# Patient Record
Sex: Male | Born: 1945
Health system: Southern US, Community
[De-identification: ages and names within clinical notes are randomized; demographics above are authoritative.]

## PROBLEM LIST (undated history)

## (undated) DIAGNOSIS — M1990 Unspecified osteoarthritis, unspecified site: Secondary | ICD-10-CM

## (undated) DIAGNOSIS — H9190 Unspecified hearing loss, unspecified ear: Secondary | ICD-10-CM

## (undated) DIAGNOSIS — Z9889 Other specified postprocedural states: Secondary | ICD-10-CM

## (undated) DIAGNOSIS — R112 Nausea with vomiting, unspecified: Secondary | ICD-10-CM

## (undated) DIAGNOSIS — G629 Polyneuropathy, unspecified: Secondary | ICD-10-CM

## (undated) DIAGNOSIS — E119 Type 2 diabetes mellitus without complications: Secondary | ICD-10-CM

## (undated) DIAGNOSIS — C20 Malignant neoplasm of rectum: Secondary | ICD-10-CM

## (undated) HISTORY — PX: KNEE SURGERY: SHX244

## (undated) HISTORY — PX: CHOLECYSTECTOMY: SHX55

## (undated) HISTORY — PX: OTHER SURGICAL HISTORY: SHX169

## (undated) HISTORY — PX: COLOSTOMY: SHX63

## (undated) HISTORY — DX: Malignant neoplasm of rectum: C20

---

## 1999-09-18 ENCOUNTER — Ambulatory Visit (HOSPITAL_COMMUNITY): Admission: RE | Admit: 1999-09-18 | Discharge: 1999-09-18 | Payer: Self-pay | Admitting: Gastroenterology

## 1999-09-18 ENCOUNTER — Encounter (INDEPENDENT_AMBULATORY_CARE_PROVIDER_SITE_OTHER): Payer: Self-pay | Admitting: *Deleted

## 1999-09-29 ENCOUNTER — Encounter: Payer: Self-pay | Admitting: Surgery

## 1999-10-09 ENCOUNTER — Inpatient Hospital Stay (HOSPITAL_COMMUNITY): Admission: RE | Admit: 1999-10-09 | Discharge: 1999-10-16 | Payer: Self-pay | Admitting: Surgery

## 1999-10-09 ENCOUNTER — Encounter (INDEPENDENT_AMBULATORY_CARE_PROVIDER_SITE_OTHER): Payer: Self-pay | Admitting: Specialist

## 1999-11-03 ENCOUNTER — Encounter: Payer: Self-pay | Admitting: Emergency Medicine

## 1999-11-03 ENCOUNTER — Inpatient Hospital Stay (HOSPITAL_COMMUNITY): Admission: EM | Admit: 1999-11-03 | Discharge: 1999-11-06 | Payer: Self-pay | Admitting: Emergency Medicine

## 1999-11-04 ENCOUNTER — Encounter: Payer: Self-pay | Admitting: Surgery

## 1999-12-12 ENCOUNTER — Encounter: Admission: RE | Admit: 1999-12-12 | Discharge: 2000-03-11 | Payer: Self-pay | Admitting: *Deleted

## 2000-06-25 ENCOUNTER — Encounter: Payer: Self-pay | Admitting: Hematology & Oncology

## 2000-06-25 ENCOUNTER — Encounter: Admission: RE | Admit: 2000-06-25 | Discharge: 2000-06-25 | Payer: Self-pay | Admitting: Hematology & Oncology

## 2000-10-28 ENCOUNTER — Encounter (INDEPENDENT_AMBULATORY_CARE_PROVIDER_SITE_OTHER): Payer: Self-pay

## 2000-10-28 ENCOUNTER — Ambulatory Visit (HOSPITAL_COMMUNITY): Admission: RE | Admit: 2000-10-28 | Discharge: 2000-10-28 | Payer: Self-pay | Admitting: Gastroenterology

## 2001-01-18 ENCOUNTER — Ambulatory Visit (HOSPITAL_COMMUNITY): Admission: RE | Admit: 2001-01-18 | Discharge: 2001-01-18 | Payer: Self-pay | Admitting: Orthopedic Surgery

## 2003-01-04 ENCOUNTER — Encounter: Payer: Self-pay | Admitting: Orthopedic Surgery

## 2003-01-04 ENCOUNTER — Ambulatory Visit (HOSPITAL_COMMUNITY): Admission: RE | Admit: 2003-01-04 | Discharge: 2003-01-04 | Payer: Self-pay | Admitting: Orthopedic Surgery

## 2004-09-17 ENCOUNTER — Ambulatory Visit: Payer: Self-pay | Admitting: Hematology & Oncology

## 2005-09-15 ENCOUNTER — Ambulatory Visit: Payer: Self-pay | Admitting: Hematology & Oncology

## 2006-09-10 ENCOUNTER — Ambulatory Visit: Payer: Self-pay | Admitting: Hematology & Oncology

## 2006-09-15 LAB — CBC WITH DIFFERENTIAL/PLATELET
Eosinophils Absolute: 0.2 10*3/uL (ref 0.0–0.5)
LYMPH%: 22.6 % (ref 14.0–48.0)
MONO#: 0.5 10*3/uL (ref 0.1–0.9)
NEUT#: 4 10*3/uL (ref 1.5–6.5)
Platelets: 254 10*3/uL (ref 145–400)
RBC: 4.88 10*6/uL (ref 4.20–5.71)
WBC: 6.2 10*3/uL (ref 4.0–10.0)

## 2006-09-15 LAB — COMPREHENSIVE METABOLIC PANEL
ALT: 11 U/L (ref 0–53)
Albumin: 4.3 g/dL (ref 3.5–5.2)
CO2: 29 mEq/L (ref 19–32)
Calcium: 9.5 mg/dL (ref 8.4–10.5)
Chloride: 106 mEq/L (ref 96–112)
Creatinine, Ser: 0.93 mg/dL (ref 0.40–1.50)
Sodium: 143 mEq/L (ref 135–145)
Total Protein: 6.9 g/dL (ref 6.0–8.3)

## 2006-09-15 LAB — CEA: CEA: 1.1 ng/mL (ref 0.0–5.0)

## 2007-09-12 ENCOUNTER — Ambulatory Visit: Payer: Self-pay | Admitting: Hematology & Oncology

## 2007-09-14 LAB — CBC WITH DIFFERENTIAL/PLATELET
Basophils Absolute: 0 10*3/uL (ref 0.0–0.1)
Eosinophils Absolute: 0.3 10*3/uL (ref 0.0–0.5)
LYMPH%: 17.8 % (ref 14.0–48.0)
MCV: 86.3 fL (ref 81.6–98.0)
MONO%: 8.1 % (ref 0.0–13.0)
NEUT#: 5.6 10*3/uL (ref 1.5–6.5)
Platelets: 271 10*3/uL (ref 145–400)
RBC: 4.54 10*6/uL (ref 4.20–5.71)

## 2007-09-14 LAB — CEA: CEA: 0.7 ng/mL (ref 0.0–5.0)

## 2008-09-19 ENCOUNTER — Ambulatory Visit: Payer: Self-pay | Admitting: Hematology & Oncology

## 2009-09-11 ENCOUNTER — Ambulatory Visit: Payer: Self-pay | Admitting: Hematology & Oncology

## 2009-09-12 LAB — CBC WITH DIFFERENTIAL (CANCER CENTER ONLY)
BASO#: 0 10*3/uL (ref 0.0–0.2)
BASO%: 0.6 % (ref 0.0–2.0)
EOS%: 4.3 % (ref 0.0–7.0)
MCH: 29.8 pg (ref 28.0–33.4)
MCHC: 33.7 g/dL (ref 32.0–35.9)
MONO%: 6 % (ref 0.0–13.0)
NEUT#: 4.5 10*3/uL (ref 1.5–6.5)
Platelets: 278 10*3/uL (ref 145–400)
RDW: 11.5 % (ref 10.5–14.6)

## 2009-09-12 LAB — COMPREHENSIVE METABOLIC PANEL
AST: 21 U/L (ref 0–37)
Alkaline Phosphatase: 58 U/L (ref 39–117)
Glucose, Bld: 310 mg/dL — ABNORMAL HIGH (ref 70–99)
Sodium: 139 mEq/L (ref 135–145)
Total Bilirubin: 0.3 mg/dL (ref 0.3–1.2)
Total Protein: 6.9 g/dL (ref 6.0–8.3)

## 2009-09-12 LAB — CEA: CEA: 0.6 ng/mL (ref 0.0–5.0)

## 2010-09-18 ENCOUNTER — Other Ambulatory Visit: Payer: Self-pay | Admitting: Hematology & Oncology

## 2010-09-18 ENCOUNTER — Encounter (HOSPITAL_BASED_OUTPATIENT_CLINIC_OR_DEPARTMENT_OTHER): Payer: Medicare Other | Admitting: Hematology & Oncology

## 2010-09-18 DIAGNOSIS — Z85048 Personal history of other malignant neoplasm of rectum, rectosigmoid junction, and anus: Secondary | ICD-10-CM

## 2010-09-18 DIAGNOSIS — C20 Malignant neoplasm of rectum: Secondary | ICD-10-CM

## 2010-09-18 LAB — CBC WITH DIFFERENTIAL (CANCER CENTER ONLY)
BASO#: 0 10*3/uL (ref 0.0–0.2)
BASO%: 0.4 % (ref 0.0–2.0)
EOS%: 2.2 % (ref 0.0–7.0)
Eosinophils Absolute: 0.2 10*3/uL (ref 0.0–0.5)
HCT: 42.8 % (ref 38.7–49.9)
HGB: 14.1 g/dL (ref 13.0–17.1)
LYMPH#: 1.7 10*3/uL (ref 0.9–3.3)
LYMPH%: 16.9 % (ref 14.0–48.0)
MCH: 29.2 pg (ref 28.0–33.4)
MCHC: 33 g/dL (ref 32.0–35.9)
MCV: 89 fL (ref 82–98)
MONO#: 0.4 10*3/uL (ref 0.1–0.9)
MONO%: 4.1 % (ref 0.0–13.0)
NEUT#: 7.8 10*3/uL — ABNORMAL HIGH (ref 1.5–6.5)
NEUT%: 76.4 % (ref 40.0–80.0)
Platelets: 257 10*3/uL (ref 145–400)
RBC: 4.83 10*6/uL (ref 4.20–5.70)
RDW: 11.8 % (ref 10.5–14.6)
WBC: 10.2 10*3/uL — ABNORMAL HIGH (ref 4.0–10.0)

## 2010-09-18 LAB — CEA: CEA: 1.2 ng/mL (ref 0.0–5.0)

## 2010-09-18 LAB — COMPREHENSIVE METABOLIC PANEL
ALT: 15 U/L (ref 0–53)
AST: 17 U/L (ref 0–37)
Albumin: 4.4 g/dL (ref 3.5–5.2)
Alkaline Phosphatase: 50 U/L (ref 39–117)
BUN: 14 mg/dL (ref 6–23)
CO2: 24 mEq/L (ref 19–32)
Calcium: 9.5 mg/dL (ref 8.4–10.5)
Chloride: 106 mEq/L (ref 96–112)
Creatinine, Ser: 0.85 mg/dL (ref 0.40–1.50)
Glucose, Bld: 199 mg/dL — ABNORMAL HIGH (ref 70–99)
Potassium: 4.6 mEq/L (ref 3.5–5.3)
Sodium: 142 mEq/L (ref 135–145)
Total Bilirubin: 0.3 mg/dL (ref 0.3–1.2)
Total Protein: 6.8 g/dL (ref 6.0–8.3)

## 2010-12-26 NOTE — Op Note (Signed)
[facility]  Patient:    Leroy Jones, Leroy Jones                       MRN: [id number] Proc. Date: 01/18/01 Adm. Date:  [id number] Attending:  Marlowe Kays Page                           Operative Report  PREOPERATIVE DIAGNOSES: 1. Torn medial meniscus. 2. Patellofemoral arthritis, right knee.  POSTOPERATIVE DIAGNOSES: 1. Torn medial and lateral menisci. 2. Grade III out of IV chondromalacia, medial femoral condyle and grade IV    out of IV chondromalacia of the patella, right knee.  OPERATION:  Right knee arthroscopy with 1) partial medial and lateral meniscectomy; 2) shaving of medial femoral condyle and patella.  SURGEON:  Illene Labrador. [place]plington, M.D.  ASSISTANT:  Nurse.  ANESTHESIA:  General.  INDICATIONS FOR PROCEDURE:  He had had previous right knee arthroscopy with partial medial meniscectomy by my former and now retired partner, Dr. Meade Maw, in 1996, and had done well until recently with progressive pain in the right knee.  MRI demonstrates the extensive tear of the posterior horn of the medial meniscus as well as some moderate chondromalacia of the medial knee joint and the patellofemoral joint.  In addition to these findings, he also had a significant tear of the posterior curve of the lateral meniscus.  DESCRIPTION OF PROCEDURE:  Satisfactory general anesthesia, pneumatic tourniquet applied, stabilizer.  The right knee was prepped with DuraPrep and draped in sterile field.  Superior medial saline inflow.  First through an anterolateral portal in medial compartment, knee joint was evaluated. Immediately apparent was significant chondromalacia of the medial femoral condyle with a flap that was partially detached.  This was pictured and debrided back with basked and shaved down until smooth with a 3.5 shaver. There were also some small particles of articular cartilage floating in the joint which I removed.  Posteriorly, he had extensive tear of  the entire medial meniscus beginning just prior to the posterior curve.  I trimmed this back to stable rim with basket, shaving down until smooth with the final rim being stable on probing.  Looking at the medial rim to the suprapatellar area, he had significant wear of the patella which I shaved down until smooth; pre and post films were taken.  I then reversed portals.  He had curve at the posterior curve which I snipped off with baskets until there was a smooth rim and then shaved it down until smooth.  The remaining meniscus was stable on probing.  No other abnormalities were noted looking at the lateral gutters other than a little additional wear of the lateral patella which I shaved. The knee joint was then irrigated until clear.  All fluid possible was removed.  The two anterior portals were closed with 4-0 nylon;  20 cc 0.5% Marcaine with adrenaline, 4 mg of morphine were then instilled through inflow apparatus which was removed and this portal closed with 4-0 nylon as well. Betadine and Adaptic dry sterile dressing were applied.  Tourniquet was released.  She tolerated the procedure well and was taken to the recovery room in satisfactory condition with no known complications. DD:  01/18/01 TD:  01/18/01 Job: [id number]

## 2011-09-17 ENCOUNTER — Encounter: Payer: Self-pay | Admitting: Hematology & Oncology

## 2011-09-17 ENCOUNTER — Other Ambulatory Visit (HOSPITAL_BASED_OUTPATIENT_CLINIC_OR_DEPARTMENT_OTHER): Payer: Medicare Other | Admitting: Lab

## 2011-09-17 ENCOUNTER — Ambulatory Visit (HOSPITAL_BASED_OUTPATIENT_CLINIC_OR_DEPARTMENT_OTHER): Payer: Medicare Other | Admitting: Hematology & Oncology

## 2011-09-17 VITALS — BP 97/63 | HR 83 | Temp 97.1°F | Ht 67.0 in | Wt 188.0 lb

## 2011-09-17 DIAGNOSIS — Z85048 Personal history of other malignant neoplasm of rectum, rectosigmoid junction, and anus: Secondary | ICD-10-CM

## 2011-09-17 DIAGNOSIS — C20 Malignant neoplasm of rectum: Secondary | ICD-10-CM

## 2011-09-17 DIAGNOSIS — C61 Malignant neoplasm of prostate: Secondary | ICD-10-CM | POA: Diagnosis not present

## 2011-09-17 DIAGNOSIS — R609 Edema, unspecified: Secondary | ICD-10-CM

## 2011-09-17 HISTORY — DX: Malignant neoplasm of rectum: C20

## 2011-09-17 LAB — CBC WITH DIFFERENTIAL (CANCER CENTER ONLY)
BASO%: 0.6 % (ref 0.0–2.0)
LYMPH#: 1.8 10*3/uL (ref 0.9–3.3)
MONO#: 0.6 10*3/uL (ref 0.1–0.9)
Platelets: 282 10*3/uL (ref 145–400)
RDW: 14.6 % (ref 11.1–15.7)
WBC: 6.7 10*3/uL (ref 4.0–10.0)

## 2011-09-17 NOTE — Progress Notes (Signed)
This office note has been dictated.

## 2011-09-18 LAB — LACTATE DEHYDROGENASE: LDH: 125 U/L (ref 94–250)

## 2011-09-18 LAB — COMPREHENSIVE METABOLIC PANEL
AST: 14 U/L (ref 0–37)
Albumin: 4.3 g/dL (ref 3.5–5.2)
Alkaline Phosphatase: 59 U/L (ref 39–117)
BUN: 18 mg/dL (ref 6–23)
Glucose, Bld: 255 mg/dL — ABNORMAL HIGH (ref 70–99)
Potassium: 5.1 mEq/L (ref 3.5–5.3)
Total Bilirubin: 0.4 mg/dL (ref 0.3–1.2)

## 2011-09-18 LAB — PSA: PSA: 1.59 ng/mL (ref <=4.00)

## 2011-09-18 LAB — SEDIMENTATION RATE: Sed Rate: 1 mm/hr (ref 0–16)

## 2011-09-18 NOTE — Progress Notes (Signed)
CC:   Dr. Webb Laws  DIAGNOSIS:  Stage I (T2 N0 M0) adenocarcinoma of the rectum.  CURRENT THERAPY:  Observation.  INTERIM HISTORY:  Mr. Leroy Jones comes in for his yearly follow-up.  He likes to come back to see [place].  He did have surgery on his right knee. He is having a tough time getting through this.  He did have surgery for this I think back in August.  He still does not have good range of motion of the knee.  Otherwise, he has had no real complaints.  He has had no problems with his diabetes.  He takes his requisite medicines for this.  He has not noted any problems with fever.  He has had no change in bowel or bladder habits.  He has had no cough or shortness of breath.  He has had no rashes.  There has been no weight loss or weight gain.  PHYSICAL EXAMINATION:  General Appearance:  This is a well-developed, well-nourished white gentleman in no obvious distress.  Vital Signs: Show a temperature of 97.1, pulse 83, respiratory rate 20, blood pressure 97/63.  Weight is 188.  Head and Neck Exam:  Shows a normocephalic, atraumatic skull.  There are no ocular or oral lesions. There are no palpable cervical or supraclavicular lymph nodes.  Lungs: Clear to percussion and auscultation bilaterally.  Cardiac Exam: Regular rate and rhythm with a normal S1 and S2.  There are no murmurs, rubs or bruits.  Abdominal Exam:  Soft with good bowel sounds.  He has a well-healed laparotomy wound.  He has no fluid wave.  His colostomy is working well.  Colostomy is in the left lower quadrant.  Back Exam:  No tenderness over the spine, ribs or hips.  Extremities:  Shows the swelling in the right knee.  This is somewhat chronic.  He has crepitus with movement of the right knee.  Skin Exam:  No rashes, ecchymosis, or petechia.  LABORATORY STUDIES:  White cell count is 6.7, hemoglobin 12.7, hematocrit 39.1, platelet count 282.  His CEA is 0.7.  IMPRESSION:  Mr. Leroy Jones is a 66 year old gentleman with  a history of stage I adenocarcinoma of the rectum.  He now is out almost 12 years from resection.  I think the resection was back in June 2001.  Again, I do not see that he has recurrent disease.  I think he is cured. He still likes to come back to see [place] yearly just for peace of mind.  We will plan to get him back in 1 year.  I did not see a need for any scans that need to be done.    ______________________________ Josph Macho, M.D. PRE/MEDQ  D:  09/17/2011  T:  09/18/2011  Job:  [id number]

## 2011-09-21 ENCOUNTER — Telehealth: Payer: Self-pay | Admitting: *Deleted

## 2011-09-21 NOTE — Telephone Encounter (Signed)
Message copied by Anselm Jungling on Mon Sep 21, 2011  2:09 PM ------      Message from: Arlan Organ R      Created: Thu Sep 17, 2011  6:10 PM       Call and tell him that labs are ok except for blood sugar.  No evideence of cancer.  pete

## 2011-09-21 NOTE — Telephone Encounter (Signed)
Called patient to let him know that his bloodwork was good, no evidence of cancer however his blood sugars are high.  Patient asked for me to send him a copy of labs.  Labwork [put in mail for paitnet

## 2012-08-23 ENCOUNTER — Other Ambulatory Visit: Payer: Self-pay | Admitting: *Deleted

## 2012-08-23 ENCOUNTER — Telehealth: Payer: Self-pay | Admitting: Hematology & Oncology

## 2012-08-23 DIAGNOSIS — C20 Malignant neoplasm of rectum: Secondary | ICD-10-CM

## 2012-08-23 NOTE — Telephone Encounter (Signed)
Patient called to sch apt.  Per RN, who checked notes, approved lab/md apt.  Patient sch apt for 09/12/12

## 2012-09-12 ENCOUNTER — Ambulatory Visit (HOSPITAL_BASED_OUTPATIENT_CLINIC_OR_DEPARTMENT_OTHER): Payer: Medicare Other | Admitting: Hematology & Oncology

## 2012-09-12 ENCOUNTER — Other Ambulatory Visit (HOSPITAL_BASED_OUTPATIENT_CLINIC_OR_DEPARTMENT_OTHER): Payer: Medicare Other | Admitting: Lab

## 2012-09-12 VITALS — BP 120/65 | HR 75 | Temp 98.2°F | Resp 18 | Ht 67.0 in | Wt 201.0 lb

## 2012-09-12 DIAGNOSIS — E119 Type 2 diabetes mellitus without complications: Secondary | ICD-10-CM

## 2012-09-12 DIAGNOSIS — C20 Malignant neoplasm of rectum: Secondary | ICD-10-CM

## 2012-09-12 DIAGNOSIS — Z85048 Personal history of other malignant neoplasm of rectum, rectosigmoid junction, and anus: Secondary | ICD-10-CM | POA: Diagnosis not present

## 2012-09-12 LAB — COMPREHENSIVE METABOLIC PANEL
ALT: 15 U/L (ref 0–53)
AST: 17 U/L (ref 0–37)
Albumin: 4.1 g/dL (ref 3.5–5.2)
Calcium: 9.3 mg/dL (ref 8.4–10.5)
Chloride: 106 mEq/L (ref 96–112)
Potassium: 3.9 mEq/L (ref 3.5–5.3)
Total Protein: 6.4 g/dL (ref 6.0–8.3)

## 2012-09-12 LAB — CBC WITH DIFFERENTIAL (CANCER CENTER ONLY)
BASO#: 0 10*3/uL (ref 0.0–0.2)
BASO%: 0.2 % (ref 0.0–2.0)
EOS%: 3.7 % (ref 0.0–7.0)
HGB: 12.5 g/dL — ABNORMAL LOW (ref 13.0–17.1)
LYMPH#: 1.9 10*3/uL (ref 0.9–3.3)
MCHC: 32.1 g/dL (ref 32.0–35.9)
NEUT#: 5.7 10*3/uL (ref 1.5–6.5)
RDW: 14.8 % (ref 11.1–15.7)

## 2012-09-12 NOTE — Progress Notes (Signed)
This office note has been dictated.

## 2012-09-13 NOTE — Progress Notes (Signed)
DIAGNOSIS:  Stage I (T2 N0 M0) adenocarcinoma of the rectum.  CURRENT THERAPY:  Observation.  INTERIM HISTORY:  Leroy Jones comes in for his yearly followup.  As always, he is doing well.  He has really gotten through his knee surgery.  He had this done I think a couple years ago.  His diabetes is doing pretty well.  He says that his hemoglobin A1c was I think 7.4.  He has had no abdominal pain.  His colostomy is working well.  He has had no cough.  He has had no fever.  He has had no bony pain.  There has been no change in his medications.  His last CEA back in February of last year was 0.7.  His PSA was 1.59.  PHYSICAL EXAMINATION:  General:  This is a well-developed, well- nourished white gentleman in no obvious distress.  Vital signs:  Show temperature of 98.2, pulse 75, respiratory rate 18, blood pressure 120/65.  Weight is 201.  Head and neck:  Shows a normocephalic, atraumatic skull.  There are no ocular or oral lesions.  There are no palpable cervical or supraclavicular lymph nodes.  Lungs:  Clear bilaterally.  Cardiac:  Regular rate and rhythm with a normal S1, S2. There are no murmurs, rubs or bruits.  Abdomen:  Soft with good bowel sounds.  There is no palpable abdominal mass.  There is no fluid wave. There is no palpable hepatosplenomegaly.  He has a well-healed laparotomy scar.  His colostomy is in the left lower quadrant.  Back: No tenderness over the spine, ribs or hips.  Extremities:  Show no clubbing, cyanosis or edema.  Neurological:  Shows no focal neurological deficits.  Skin:  Shows some slightly dry skin.  LABORATORY STUDIES:  Show a white cell count of 8.6, hemoglobin 12.5, hematocrit 38.9, platelet count 276.  IMPRESSION:  Leroy Jones is a real nice 67 year old gentleman with a remote history of stage I adenocarcinoma of the rectum.  He now is out 13 years.  He still has to come back to see [place] for followup.  Of note, his resection was back in June of  2001.  We will go ahead and see him back in a year.  I think that his risk of cancer is going to be a 2nd primary and not recurrence of his rectal cancer.    ______________________________ Josph Macho, M.D. PRE/MEDQ  D:  09/12/2012  T:  09/13/2012  Job:  [id number]

## 2012-09-14 ENCOUNTER — Telehealth: Payer: Self-pay | Admitting: *Deleted

## 2012-09-14 NOTE — Telephone Encounter (Signed)
Called patients wife to let her know that her husbands labs are ok per dr. Myna Hidalgo

## 2012-09-14 NOTE — Telephone Encounter (Signed)
Message copied by Anselm Jungling on Wed Sep 14, 2012  9:43 AM ------      Message from: Josph Macho      Created: Tue Sep 13, 2012  2:50 PM       Please call and tell him that his labs are okay.

## 2013-05-10 ENCOUNTER — Encounter (HOSPITAL_COMMUNITY): Payer: Self-pay | Admitting: Emergency Medicine

## 2013-05-10 ENCOUNTER — Observation Stay (HOSPITAL_COMMUNITY)
Admission: EM | Admit: 2013-05-10 | Discharge: 2013-05-11 | Disposition: A | Discharge status: Home / Self Care | Payer: Non-veteran care | Attending: Internal Medicine | Admitting: Internal Medicine

## 2013-05-10 ENCOUNTER — Emergency Department (HOSPITAL_COMMUNITY): Payer: Non-veteran care

## 2013-05-10 DIAGNOSIS — E875 Hyperkalemia: Secondary | ICD-10-CM | POA: Diagnosis not present

## 2013-05-10 DIAGNOSIS — Z885 Allergy status to narcotic agent status: Secondary | ICD-10-CM | POA: Diagnosis not present

## 2013-05-10 DIAGNOSIS — C20 Malignant neoplasm of rectum: Secondary | ICD-10-CM

## 2013-05-10 DIAGNOSIS — Z794 Long term (current) use of insulin: Secondary | ICD-10-CM | POA: Insufficient documentation

## 2013-05-10 DIAGNOSIS — Z79899 Other long term (current) drug therapy: Secondary | ICD-10-CM | POA: Diagnosis not present

## 2013-05-10 DIAGNOSIS — R404 Transient alteration of awareness: Secondary | ICD-10-CM | POA: Diagnosis not present

## 2013-05-10 DIAGNOSIS — D72829 Elevated white blood cell count, unspecified: Secondary | ICD-10-CM

## 2013-05-10 DIAGNOSIS — Z85048 Personal history of other malignant neoplasm of rectum, rectosigmoid junction, and anus: Secondary | ICD-10-CM | POA: Diagnosis not present

## 2013-05-10 DIAGNOSIS — R55 Syncope and collapse: Principal | ICD-10-CM | POA: Diagnosis present

## 2013-05-10 DIAGNOSIS — E119 Type 2 diabetes mellitus without complications: Secondary | ICD-10-CM | POA: Insufficient documentation

## 2013-05-10 DIAGNOSIS — C218 Malignant neoplasm of overlapping sites of rectum, anus and anal canal: Secondary | ICD-10-CM | POA: Diagnosis not present

## 2013-05-10 HISTORY — DX: Type 2 diabetes mellitus without complications: E11.9

## 2013-05-10 LAB — CBC
HCT: 36.7 % — ABNORMAL LOW (ref 39.0–52.0)
HCT: 35.5 % — ABNORMAL LOW (ref 39.0–52.0)
MCH: 27.7 pg (ref 26.0–34.0)
MCHC: 33.2 g/dL (ref 30.0–36.0)
MCHC: 33.8 g/dL (ref 30.0–36.0)
MCV: 83.4 fL (ref 78.0–100.0)
Platelets: 241 10*3/uL (ref 150–400)
RBC: 4.28 MIL/uL (ref 4.22–5.81)
RDW: 14.4 % (ref 11.5–15.5)
RDW: 14.5 % (ref 11.5–15.5)
WBC: 13.8 10*3/uL — ABNORMAL HIGH (ref 4.0–10.5)
WBC: 12.1 10*3/uL — ABNORMAL HIGH (ref 4.0–10.5)

## 2013-05-10 LAB — HEMOGLOBIN A1C
Hgb A1c MFr Bld: 7.5 % — ABNORMAL HIGH (ref <5.7)
Mean Plasma Glucose: 169 mg/dL — ABNORMAL HIGH (ref <117)

## 2013-05-10 LAB — BASIC METABOLIC PANEL
BUN: 21 mg/dL (ref 6–23)
Calcium: 9.5 mg/dL (ref 8.4–10.5)
Chloride: 104 mEq/L (ref 96–112)
Creatinine, Ser: 0.85 mg/dL (ref 0.50–1.35)
GFR calc Af Amer: >90 mL/min (ref >90)
GFR calc non Af Amer: 88 mL/min — ABNORMAL LOW (ref >90)

## 2013-05-10 LAB — CREATININE, SERUM
GFR calc Af Amer: >90 mL/min (ref >90)
GFR calc non Af Amer: 87 mL/min — ABNORMAL LOW (ref >90)

## 2013-05-10 LAB — POCT I-STAT TROPONIN I

## 2013-05-10 LAB — GLUCOSE, CAPILLARY

## 2013-05-10 MED ORDER — SODIUM CHLORIDE 0.9 % IV SOLN
INTRAVENOUS | Status: DC
  Administered 2013-05-10 – 2013-05-11 (×2): via INTRAVENOUS

## 2013-05-10 MED ORDER — INSULIN GLARGINE 100 UNIT/ML ~~LOC~~ SOLN
28.0000 [IU] | Freq: Every day | SUBCUTANEOUS | Status: DC
  Administered 2013-05-10: 28 [IU] via SUBCUTANEOUS
  Filled 2013-05-10 (×2): qty 0.28

## 2013-05-10 MED ORDER — TAMSULOSIN HCL 0.4 MG PO CAPS
0.4000 mg | ORAL_CAPSULE | Freq: Every day | ORAL | Status: DC
  Administered 2013-05-10 – 2013-05-11 (×2): 0.4 mg via ORAL
  Filled 2013-05-10 (×2): qty 1

## 2013-05-10 MED ORDER — INSULIN ASPART 100 UNIT/ML ~~LOC~~ SOLN
0.0000 [IU] | Freq: Three times a day (TID) | SUBCUTANEOUS | Status: DC
  Administered 2013-05-10: 8 [IU] via SUBCUTANEOUS
  Administered 2013-05-11: 5 [IU] via SUBCUTANEOUS
  Administered 2013-05-11: 2 [IU] via SUBCUTANEOUS
  Administered 2013-05-11: 5 [IU] via SUBCUTANEOUS

## 2013-05-10 MED ORDER — SODIUM CHLORIDE 0.9 % IV SOLN: 250.0000 mL | INTRAVENOUS | Status: DC | PRN

## 2013-05-10 MED ORDER — ATORVASTATIN CALCIUM 80 MG PO TABS
80.0000 mg | ORAL_TABLET | Freq: Every day | ORAL | Status: DC
  Administered 2013-05-10 – 2013-05-11 (×2): 80 mg via ORAL
  Filled 2013-05-10 (×2): qty 1

## 2013-05-10 MED ORDER — SERTRALINE HCL 100 MG PO TABS
100.0000 mg | ORAL_TABLET | Freq: Every day | ORAL | Status: DC
  Administered 2013-05-10 – 2013-05-11 (×2): 100 mg via ORAL
  Filled 2013-05-10 (×2): qty 1

## 2013-05-10 MED ORDER — ONDANSETRON HCL 4 MG/2ML IJ SOLN: 4.0000 mg | Freq: Four times a day (QID) | INTRAMUSCULAR | Status: DC | PRN

## 2013-05-10 MED ORDER — GABAPENTIN 300 MG PO CAPS
300.0000 mg | ORAL_CAPSULE | Freq: Three times a day (TID) | ORAL | Status: DC
  Administered 2013-05-10 – 2013-05-11 (×4): 300 mg via ORAL
  Filled 2013-05-10 (×5): qty 1

## 2013-05-10 MED ORDER — HEPARIN SODIUM (PORCINE) 5000 UNIT/ML IJ SOLN
5000.0000 [IU] | Freq: Three times a day (TID) | INTRAMUSCULAR | Status: DC
  Administered 2013-05-10 – 2013-05-11 (×3): 5000 [IU] via SUBCUTANEOUS
  Filled 2013-05-10 (×5): qty 1

## 2013-05-10 MED ORDER — SODIUM POLYSTYRENE SULFONATE 15 GM/60ML PO SUSP
15.0000 g | Freq: Once | ORAL | Status: AC
  Administered 2013-05-10: 15 g via ORAL
  Filled 2013-05-10: qty 60

## 2013-05-10 MED ORDER — ONDANSETRON HCL 4 MG PO TABS: 4.0000 mg | ORAL_TABLET | Freq: Four times a day (QID) | ORAL | Status: DC | PRN

## 2013-05-10 MED ORDER — SODIUM CHLORIDE 0.9 % IJ SOLN
3.0000 mL | Freq: Two times a day (BID) | INTRAMUSCULAR | Status: DC
  Administered 2013-05-10 (×2): 3 mL via INTRAVENOUS

## 2013-05-10 MED ORDER — SODIUM CHLORIDE 0.9 % IJ SOLN: 3.0000 mL | INTRAMUSCULAR | Status: DC | PRN

## 2013-05-10 MED ORDER — SODIUM CHLORIDE 0.9 % IJ SOLN: 3.0000 mL | Freq: Two times a day (BID) | INTRAMUSCULAR | Status: DC

## 2013-05-10 NOTE — ED Notes (Signed)
Doctor ordered patient able to eat.  Wife decided to go out of hospital for food.

## 2013-05-10 NOTE — ED Provider Notes (Signed)
CSN: [id number]     Arrival date & time 05/10/13  2952 History   First MD Initiated Contact with Patient 05/10/13 1006     Chief Complaint  Patient presents with  . Near Syncope   (Consider location/radiation/quality/duration/timing/severity/associated sxs/prior Treatment) HPI Comments: 67 year old male presents with a near syncopal episode. He states he was standing talking to a friend and acutely came pale and fell he has not passed out. He denies any nausea or vomiting. He denies any headaches. Never had chest pain or shortness of breath. He did have to slowly slide and sit down. He did not fall or injure himself or pass out. He said this happened at once before one month ago but never sought medical care. He states it lasted a total of about 5 minutes. Any felt better. This does not happen right after standing. Is not had any reason to be dehydrated such as diarrhea or polyuria. He's been eating and drinking normally. Is not any fevers or other infection. When I feels normal.  The history is provided by the patient.    Past Medical History  Diagnosis Date  . Rectal cancer 09/17/2011  . Diabetes mellitus without complication    History reviewed. No pertinent past surgical history. No family history on file. History  Substance Use Topics  . Smoking status: Never Smoker   . Smokeless tobacco: Not on file  . Alcohol Use: No    Review of Systems  Constitutional: Negative for fever and chills.  Eyes: Negative for photophobia and visual disturbance.  Respiratory: Negative for shortness of breath.   Cardiovascular: Negative for chest pain.  Gastrointestinal: Negative for nausea, vomiting and abdominal pain.  Neurological: Positive for syncope (Near-syncope) and light-headedness. Negative for weakness and headaches.  All other systems reviewed and are negative.    Allergies  Codeine and Oxycodone hcl  Home Medications   Current Outpatient Rx  Name  Route  Sig  Dispense  Refill   . cyanocobalamin 1000 MCG tablet   Oral   Take 100 mcg by mouth 2 (two) times daily.         Marland Kitchen gabapentin (NEURONTIN) 300 MG capsule   Oral   Take 300 mg by mouth 3 (three) times daily.         . Glucosamine HCl (GLUCOSAMINE PO)   Oral   Take 1 tablet by mouth daily.         . insulin aspart (NOVOLOG) 100 UNIT/ML injection   Subcutaneous   Inject into the skin 3 (three) times daily before meals.         . insulin glargine (LANTUS) 100 UNIT/ML injection   Subcutaneous   Inject 32 Units into the skin at bedtime.         . metFORMIN (GLUCOPHAGE) 1000 MG tablet   Oral   Take 1,000 mg by mouth 2 (two) times daily with a meal.         . rosuvastatin (CRESTOR) 40 MG tablet   Oral   Take 40 mg by mouth daily.         . sertraline (ZOLOFT) 100 MG tablet   Oral   Take 100 mg by mouth daily.         . Tamsulosin HCl (FLOMAX) 0.4 MG CAPS   Oral   Take 0.4 mg by mouth daily.          BP 128/67  Pulse 83  Temp(Src) 98.7 F (37.1 C) (Oral)  Resp 16  SpO2 99%  Physical Exam  Nursing note and vitals reviewed. Constitutional: He is oriented to person, place, and time. He appears well-developed and well-nourished.  HENT:  Head: Normocephalic and atraumatic.  Right Ear: External ear normal.  Left Ear: External ear normal.  Nose: Nose normal.  Eyes: EOM are normal. Pupils are equal, round, and reactive to light. Right eye exhibits no discharge. Left eye exhibits no discharge.  Neck: Neck supple.  Cardiovascular: Normal rate, regular rhythm, normal heart sounds and intact distal pulses.   Pulmonary/Chest: Effort normal and breath sounds normal. He has no wheezes. He has no rales. He exhibits no tenderness.  Abdominal: Soft. There is no tenderness.  Musculoskeletal: He exhibits no edema.  Neurological: He is alert and oriented to person, place, and time. He has normal strength. No cranial nerve deficit or sensory deficit. GCS eye subscore is 4. GCS verbal subscore  is 5. GCS motor subscore is 6.  Skin: Skin is warm and dry.    ED Course  Procedures (including critical care time) Labs Review Labs Reviewed  CBC - Abnormal; Notable for the following:    WBC 13.8 (*)    Hemoglobin 12.2 (*)    HCT 36.7 (*)    All other components within normal limits  BASIC METABOLIC PANEL - Abnormal; Notable for the following:    Potassium 5.3 (*)    Glucose, Bld 240 (*)    GFR calc non Af Amer 88 (*)    All other components within normal limits  GLUCOSE, CAPILLARY - Abnormal; Notable for the following:    Glucose-Capillary 222 (*)    All other components within normal limits  OCCULT BLOOD, POC DEVICE   Imaging Review Dg Chest 2 View  05/10/2013   CLINICAL DATA:  Near syncope, history diabetes, rectal cancer  EXAM: CHEST  2 VIEW  COMPARISON:  None  FINDINGS: Normal heart size, mediastinal contours, and pulmonary vascularity.  Lungs clear.  No pleural effusion or pneumothorax.  No acute osseous findings.  IMPRESSION: No acute abnormalities.   Electronically Signed   By: Ulyses Southward M.D.   On: 05/10/2013 11:23    Date: 05/10/2013  Rate: 77  Rhythm: normal sinus rhythm  QRS Axis: normal  Intervals: normal  ST/T Wave abnormalities: normal  Conduction Disutrbances:none  Narrative Interpretation:   Old EKG Reviewed: none available    MDM   1. Near syncope    No obvious cause for patient's near-syncope. The patient has normal neuro exam and had no headache or weakness. Denied any chest pain or shortness of breath. Due to his age and risk factors is concern for a cardiac cause. Will keep patient on telemetry and admit for observation to the hospitalist.    Audree Camel, MD 05/10/13 1550

## 2013-05-10 NOTE — ED Notes (Signed)
At work today developed "hot feeling, sweaty, pale" for 2 minutres.  Changed medication Metformin to take at night instead of morning recently.  Denies pain currently. Answering and following commands appropriate.

## 2013-05-10 NOTE — H&P (Signed)
[facility] History and Physical  Leroy Jones ZOX:[id number] DOB: [birth year] DOA: 05/10/2013  Referring physician: Dr. Lawrence Marseilles PCP: No primary provider on file.  Specialists: none  Chief Complaint: Near syncope  HPI: Leroy Jones is a 67 y.o. male  With h/o DM on metformin and insulin who presented to the ED after having a near syncopal episode this morning.  He reports that he checked his blood sugar this am fasting and reportedly was 38.  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  States that he was on his feet and leaning up against a wall when he seemed to feel as if he was going to faint.  He states that he did not faint and has no recollection of the event but states that people saw him go out of it.  He denies falling or loosing consciousness and states that no one reported any seizure like activity.  Denies any bowel or bladder incontinence. Denies any heart palpitations or chest pain.  The problem happened insidiously and he is not aware of anything that made it better or worse.  He states that he checked his blood sugar near the moment of fainting and found it to be in the 150's.    In the ED work up was negative per my discussion with ED physician but we were consulted for further admission evaluation and recommendations.   Review of Systems: 10 point review of systems reviewed and negative unless listed above.   Past Medical History  Diagnosis Date  . Rectal cancer 09/17/2011  . Diabetes mellitus without complication    History reviewed. No pertinent past surgical history. Social History:  reports that he has never smoked. He does not have any smokeless tobacco history on file. He reports that he does not drink alcohol or use illicit drugs. Lives at home with family  Can patient participate in ADLs? yes  Allergies  Allergen Reactions  . Codeine Nausea And Vomiting  . Oxycodone Hcl Nausea And  Vomiting    No family history on file.  None contributory  Prior to Admission medications   Medication Sig Start Date End Date Taking? Authorizing Provider  cyanocobalamin 1000 MCG tablet Take 100 mcg by mouth 2 (two) times daily.   Yes Historical Provider, MD  gabapentin (NEURONTIN) 300 MG capsule Take 300 mg by mouth 3 (three) times daily.   Yes Historical Provider, MD  Glucosamine HCl (GLUCOSAMINE PO) Take 1 tablet by mouth daily.   Yes Historical Provider, MD  insulin aspart (NOVOLOG) 100 UNIT/ML injection Inject into the skin 3 (three) times daily before meals.   Yes Historical Provider, MD  insulin glargine (LANTUS) 100 UNIT/ML injection Inject 32 Units into the skin at bedtime.   Yes Historical Provider, MD  metFORMIN (GLUCOPHAGE) 1000 MG tablet Take 1,000 mg by mouth 2 (two) times daily with a meal.   Yes Historical Provider, MD  rosuvastatin (CRESTOR) 40 MG tablet Take 40 mg by mouth daily.   Yes Historical Provider, MD  sertraline (ZOLOFT) 100 MG tablet Take 100 mg by mouth daily.   Yes Historical Provider, MD  Tamsulosin HCl (FLOMAX) 0.4 MG CAPS Take 0.4 mg by mouth daily.   Yes Historical Provider, MD   Physical Exam: Filed Vitals:   05/10/13 1409  BP: 155/75  Pulse: 69  Temp: 97.5 F (36.4 C)  Resp: 16     General:  Pt  in NAD, Alert and Awake, smiling at times  Eyes: EOMI, non icteric  ENT: normal exterior appearance, no masses on visual examination  Neck: supple, no goiter  Cardiovascular: RRR, no MRG  Respiratory: CTA BL, no wheezes  Abdomen: soft, NT, ND  Skin: warm and dry  Musculoskeletal: no cyanosis or clubbing  Psychiatric: mood and affect appropriate.  Neurologic: answers questions appropriately moves extremities equally.  Labs on Admission:  Basic Metabolic Panel:  Recent Labs Lab 05/10/13 1024  NA 139  K 5.3*  CL 104  CO2 28  GLUCOSE 240*  BUN 21  CREATININE 0.85  CALCIUM 9.5   Liver Function Tests: No results found for this  basename: AST, ALT, ALKPHOS, BILITOT, PROT, ALBUMIN,  in the last 168 hours No results found for this basename: LIPASE, AMYLASE,  in the last 168 hours No results found for this basename: AMMONIA,  in the last 168 hours CBC:  Recent Labs Lab 05/10/13 1024  WBC 13.8*  HGB 12.2*  HCT 36.7*  MCV 83.4  PLT 269   Cardiac Enzymes: No results found for this basename: CKTOTAL, CKMB, CKMBINDEX, TROPONINI,  in the last 168 hours  BNP (last 3 results) No results found for this basename: PROBNP,  in the last 8760 hours CBG:  Recent Labs Lab 05/10/13 1014  GLUCAP 222*    Radiological Exams on Admission: Dg Chest 2 View  05/10/2013   CLINICAL DATA:  Near syncope, history diabetes, rectal cancer  EXAM: CHEST  2 VIEW  COMPARISON:  None  FINDINGS: Normal heart size, mediastinal contours, and pulmonary vascularity.  Lungs clear.  No pleural effusion or pneumothorax.  No acute osseous findings.  IMPRESSION: No acute abnormalities.   Electronically Signed   By: Ulyses Southward M.D.   On: 05/10/2013 11:23    EKG: Independently reviewed. Normal sinus rhythm. No ST elevation or depression.  Assessment/Plan Active Problems:  1. Near syncope - TSH, Carotid dopplers, Echocardiogram - telemetry monitoring assessing for arrthmias - At this juncture do not suspect seizure as cause as such will not order EEG - Obtain orthostatic vital signs q 12 hours  - Based on history suspecting hypoglycemia vs decrease intravascular volume from not enough fluid intake.  Of note patient's BUN/Creatinine ratio elevated.  2. Leukocytosis - no fevers, no sirs criteria - suspect stress demaginalization - repeat cbc next am.  3. Hyperkalemia - Will administer kayexalate x 1 and reassess next am. As well as IVF's  Code Status: full Family Communication: Discussed with patient at bedside no family present Disposition Plan: Pending work up, if negative would consider discharge if no near syncope after fluid  rehydration.  Time spent: > 60 minutes  Penny Pia [facility] Pager [phone]  If 7PM-7AM, please contact night-coverage [email or web address] Password [email or web address] 05/10/2013, 3:09 PM

## 2013-05-10 NOTE — Progress Notes (Signed)
CSw met with the Pt at the bedside. Wife was present.   Pt had questions about reporting ED visits to the [place].   CSW validated that Pt's visit should be reported as soon as possible.   Wife stated that she will contact the [place] when a decision is made about Pt's possibility of admission.   Pt will contact [facility] back today.  No further needs at this time.   Leron Croak, LCSWA [facility] Emergency Dept.  [phone]

## 2013-05-10 NOTE — ED Notes (Signed)
EKG completed 0940

## 2013-05-10 NOTE — Progress Notes (Signed)
VASCULAR LAB PRELIMINARY  PRELIMINARY  PRELIMINARY  PRELIMINARY  Carotid duplex completed.    Preliminary report:  Bilateral:  1-39% ICA stenosis.  Vertebral artery flow is antegrade.      Summer Parthasarathy, RVT 05/10/2013, 5:17 PM

## 2013-05-10 NOTE — ED Notes (Signed)
Left message with social worker to discuss insurance.

## 2013-05-11 DIAGNOSIS — I517 Cardiomegaly: Secondary | ICD-10-CM | POA: Diagnosis not present

## 2013-05-11 DIAGNOSIS — R55 Syncope and collapse: Secondary | ICD-10-CM | POA: Diagnosis not present

## 2013-05-11 LAB — BASIC METABOLIC PANEL
BUN: 14 mg/dL (ref 6–23)
CO2: 27 mEq/L (ref 19–32)
Chloride: 103 mEq/L (ref 96–112)
Creatinine, Ser: 0.79 mg/dL (ref 0.50–1.35)
GFR calc Af Amer: >90 mL/min (ref >90)
Potassium: 3.7 mEq/L (ref 3.5–5.1)

## 2013-05-11 LAB — CBC
HCT: 35.8 % — ABNORMAL LOW (ref 39.0–52.0)
Hemoglobin: 12 g/dL — ABNORMAL LOW (ref 13.0–17.0)
MCHC: 33.5 g/dL (ref 30.0–36.0)
MCV: 83.1 fL (ref 78.0–100.0)
Platelets: 252 10*3/uL (ref 150–400)
RDW: 14.4 % (ref 11.5–15.5)
WBC: 9.8 10*3/uL (ref 4.0–10.5)

## 2013-05-11 LAB — GLUCOSE, CAPILLARY
Glucose-Capillary: 124 mg/dL — ABNORMAL HIGH (ref 70–99)
Glucose-Capillary: 223 mg/dL — ABNORMAL HIGH (ref 70–99)

## 2013-05-11 NOTE — Progress Notes (Signed)
UR Completed Sophiamarie Nease Graves-Bigelow, RN,BSN [phone]  

## 2013-05-11 NOTE — Discharge Summary (Signed)
Physician Discharge Summary  Leroy Jones JXB:[id number] DOB: 07-18-1946 DOA: 05/10/2013  PCP: Genelle Gather, MD  Admit date: 05/10/2013 Discharge date: 05/11/2013  Recommendations for Outpatient Follow-up:  1. Monitor orthostatic vital signs  Discharge Diagnoses:  Active Problems:   Near syncope, suspect orthostatic hypotension   Hyperkalemia   Leukocytosis, unspecified DM 2  Discharge Condition: stable  Filed Weights   05/10/13 1409  Weight: 89.4 kg (197 lb 1.5 oz)    History of present illness:   67 y.o. male  With h/o DM on metformin and insulin who presented to the ED after having a near syncopal episode this morning. He reports that he checked his blood sugar this am fasting and reportedly was 38. 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 States that he was on his feet and leaning up against a wall when he seemed to feel as if he was going to faint. He states that he did not faint and has no recollection of the event but states that people saw him go out of it. He denies falling or loosing consciousness and states that no one reported any seizure like activity. Denies any bowel or bladder incontinence. Denies any heart palpitations or chest pain. The problem happened insidiously and he is not aware of anything that made it better or worse. He states that he checked his blood sugar near the moment of fainting and found it to be in the 150's.  In the ED work up was negative per my discussion with ED physician but we were consulted for further admission evaluation and recommendations.   Hospital Course:  Patient had no further syncopal episodes, hypoglycemia. With orthostatics, he did trend toward hypotension. Upon further questioning, he has reported orthostatic dizziness. Suspect his syncope may have been related to orthostatic hypotension. He was able to ambulate. Workup was negative and he is clear for  discharge.  Procedures:  none  Consultations:  none  Discharge Exam: Filed Vitals:   05/11/13 1400  BP: 155/76  Pulse: 76  Temp: 97.2 F (36.2 C)  Resp: 20    General: alert, oriented Cardiovascular: RRR without MGR Respiratory: CTA without WRR EXT no CCE  Discharge Instructions  Discharge Orders   Future Appointments Provider Department Dept Phone   09/11/2013 2:30 PM Rachael Fee [facility] [phone]   09/11/2013 3:00 PM Josph Macho, MD [facility] [phone]   Future Orders Complete By Expires   Diet - low sodium heart healthy  As directed    Diet Carb Modified  As directed    Increase activity slowly  As directed        Medication List         cyanocobalamin 1000 MCG tablet  Take 100 mcg by mouth 2 (two) times daily.     gabapentin 300 MG capsule  Commonly known as:  NEURONTIN  Take 300 mg by mouth 3 (three) times daily.     GLUCOSAMINE PO  Take 1 tablet by mouth daily.     insulin aspart 100 UNIT/ML injection  Commonly known as:  novoLOG  Inject into the skin 3 (three) times daily before meals.     insulin glargine 100 UNIT/ML injection  Commonly known as:  LANTUS  Inject 32 Units into the skin at bedtime.     metFORMIN 1000 MG tablet  Commonly known as:  GLUCOPHAGE  Take 1,000 mg by mouth 2 (two) times daily with a meal.     rosuvastatin 40 MG tablet  Commonly known as:  CRESTOR  Take 40 mg by mouth daily.     sertraline 100 MG tablet  Commonly known as:  ZOLOFT  Take 100 mg by mouth daily.     tamsulosin 0.4 MG Caps capsule  Commonly known as:  FLOMAX  Take 0.4 mg by mouth daily.       Allergies  Allergen Reactions  . Codeine Nausea And Vomiting  . Oxycodone Hcl Nausea And Vomiting       Follow-up Information   Follow up with PERRY, ANGELA L, MD In 2 weeks.   Specialty:  Internal Medicine   Contact information:   [address] [place]  [address] [phone]        The results of significant diagnostics from this hospitalization (including imaging, microbiology, ancillary and laboratory) are listed below for reference.    Significant Diagnostic Studies: Dg Chest 2 View  05/10/2013   CLINICAL DATA:  Near syncope, history diabetes, rectal cancer  EXAM: CHEST  2 VIEW  COMPARISON:  None  FINDINGS: Normal heart size, mediastinal contours, and pulmonary vascularity.  Lungs clear.  No pleural effusion or pneumothorax.  No acute osseous findings.  IMPRESSION: No acute abnormalities.   Electronically Signed   By: Ulyses Southward M.D.   On: 05/10/2013 11:23   Echo Left ventricle: The cavity size was normal. Wall thickness was increased in a pattern of mild LVH. Systolic function was normal. The estimated ejection fraction was in the range of 60% to 65%. Wall motion was normal; there were no regional wall motion abnormalities. Left ventricular diastolic function parameters were normal. - Right atrium: The atrium was mildly dilated.  EKG Sinus rhythm Anteroseptal infarct, old  Carotid doppler The vertebral arteries appear patent with antegrade flow. - Findings consistent with 1-39 percent stenosis involving the right internal carotid artery and the left internal carotid artery.  Microbiology: No results found for this or any previous visit (from the past 240 hour(s)).   Labs: Basic Metabolic Panel:  Recent Labs Lab 05/10/13 1024 05/10/13 1600 05/11/13 0553  NA 139  --  140  K 5.3*  --  3.7  CL 104  --  103  CO2 28  --  27  GLUCOSE 240*  --  133*  BUN 21  --  14  CREATININE 0.85 0.88 0.79  CALCIUM 9.5  --  9.2   Liver Function Tests: No results found for this basename: AST, ALT, ALKPHOS, BILITOT, PROT, ALBUMIN,  in the last 168 hours No results found for this basename: LIPASE, AMYLASE,  in the last 168 hours No results found for this basename: AMMONIA,  in the last 168 hours CBC:  Recent Labs Lab  05/10/13 1024 05/10/13 1600 05/11/13 0553  WBC 13.8* 12.1* 9.8  HGB 12.2* 12.0* 12.0*  HCT 36.7* 35.5* 35.8*  MCV 83.4 82.9 83.1  PLT 269 241 252   Cardiac Enzymes: No results found for this basename: CKTOTAL, CKMB, CKMBINDEX, TROPONINI,  in the last 168 hours BNP: BNP (last 3 results) No results found for this basename: PROBNP,  in the last 8760 hours CBG:  Recent Labs Lab 05/10/13 1612 05/10/13 2059 05/11/13 0742 05/11/13 1149 05/11/13 1629  GLUCAP 278* 190* 124* 207* 223*   Signed:  Briaunna Grindstaff L  [facility] 05/11/2013, 5:42 PM

## 2013-05-11 NOTE — Progress Notes (Signed)
  Echocardiogram 2D Echocardiogram has been performed.  Leroy Jones 05/11/2013, 4:13 PM

## 2013-09-11 ENCOUNTER — Encounter: Payer: Self-pay | Admitting: Hematology & Oncology

## 2013-09-11 ENCOUNTER — Ambulatory Visit (HOSPITAL_BASED_OUTPATIENT_CLINIC_OR_DEPARTMENT_OTHER): Payer: Medicare Other | Admitting: Hematology & Oncology

## 2013-09-11 ENCOUNTER — Other Ambulatory Visit (HOSPITAL_BASED_OUTPATIENT_CLINIC_OR_DEPARTMENT_OTHER): Payer: Medicare Other | Admitting: Lab

## 2013-09-11 VITALS — BP 132/62 | HR 69 | Temp 98.1°F | Resp 69 | Ht 71.0 in | Wt 195.0 lb

## 2013-09-11 DIAGNOSIS — E119 Type 2 diabetes mellitus without complications: Secondary | ICD-10-CM

## 2013-09-11 DIAGNOSIS — Z85048 Personal history of other malignant neoplasm of rectum, rectosigmoid junction, and anus: Secondary | ICD-10-CM | POA: Diagnosis not present

## 2013-09-11 DIAGNOSIS — C20 Malignant neoplasm of rectum: Secondary | ICD-10-CM | POA: Diagnosis not present

## 2013-09-11 LAB — CBC WITH DIFFERENTIAL (CANCER CENTER ONLY)
BASO#: 0 10*3/uL (ref 0.0–0.2)
BASO%: 0.3 % (ref 0.0–2.0)
EOS%: 4.6 % (ref 0.0–7.0)
Eosinophils Absolute: 0.5 10*3/uL (ref 0.0–0.5)
HCT: 39.6 % (ref 38.7–49.9)
HGB: 12.7 g/dL — ABNORMAL LOW (ref 13.0–17.1)
LYMPH#: 2.1 10*3/uL (ref 0.9–3.3)
LYMPH%: 19.4 % (ref 14.0–48.0)
MCH: 27.3 pg — ABNORMAL LOW (ref 28.0–33.4)
MCHC: 32.1 g/dL (ref 32.0–35.9)
MCV: 85 fL (ref 82–98)
MONO#: 0.9 10*3/uL (ref 0.1–0.9)
MONO%: 8.4 % (ref 0.0–13.0)
NEUT%: 67.3 % (ref 40.0–80.0)
NEUTROS ABS: 7.3 10*3/uL — AB (ref 1.5–6.5)
Platelets: 291 10*3/uL (ref 145–400)
RBC: 4.66 10*6/uL (ref 4.20–5.70)
RDW: 14.4 % (ref 11.1–15.7)
WBC: 10.8 10*3/uL — ABNORMAL HIGH (ref 4.0–10.0)

## 2013-09-12 LAB — COMPREHENSIVE METABOLIC PANEL
ALK PHOS: 54 U/L (ref 39–117)
ALT: 10 U/L (ref 0–53)
AST: 13 U/L (ref 0–37)
Albumin: 4.2 g/dL (ref 3.5–5.2)
BILIRUBIN TOTAL: 0.3 mg/dL (ref 0.2–1.2)
BUN: 11 mg/dL (ref 6–23)
CO2: 30 mEq/L (ref 19–32)
Calcium: 9.9 mg/dL (ref 8.4–10.5)
Chloride: 106 mEq/L (ref 96–112)
Creatinine, Ser: 0.88 mg/dL (ref 0.50–1.35)
Glucose, Bld: 176 mg/dL — ABNORMAL HIGH (ref 70–99)
POTASSIUM: 5 meq/L (ref 3.5–5.3)
Sodium: 144 mEq/L (ref 135–145)
Total Protein: 6.5 g/dL (ref 6.0–8.3)

## 2013-09-12 LAB — CEA: CEA: 1 ng/mL (ref 0.0–5.0)

## 2013-09-13 NOTE — Progress Notes (Signed)
This office note has been dictated.

## 2013-09-14 ENCOUNTER — Encounter: Payer: Self-pay | Admitting: *Deleted

## 2013-09-14 ENCOUNTER — Telehealth: Payer: Self-pay | Admitting: *Deleted

## 2013-09-14 NOTE — Telephone Encounter (Signed)
Called patient to let him know his labs were ok per dr. Niel Hummer. labwork mailed to patient per patient request

## 2013-09-14 NOTE — Telephone Encounter (Signed)
Message copied by Rico Ala on Thu Sep 14, 2013 11:35 AM ------      Message from: Burney Gauze R      Created: Wed Sep 13, 2013  7:35 AM       Call - labs are ok!!  Laurey Arrow ------

## 2013-09-15 NOTE — Progress Notes (Signed)
CC:   Verline Lema, M.D.  DIAGNOSIS:  Stage I (T2 N0 M0) adenocarcinoma of the rectum.  CURRENT THERAPY:  Observation.  INTERIM HISTORY:  Mr. Zarcone comes in for followup.  We see him every year.  He is mostly followed by the [place].  He has been doing pretty well.  He has had no complaints since we last saw him.  He does have diabetes.  I am not sure how well his diabetes is controlled.  I think he is on insulin for the diabetes.  He has had no issues with the colostomy.  He has had no bleeding with the colostomy.  He has had no leg swelling.  He has had no rashes.  He has had no cough or shortness of breath.  He has had no headache.  PHYSICAL EXAMINATION:  General:  This is a well-developed, well- nourished white gentleman, in no obvious distress.  Vital Signs: Temperature of 98.1, pulse 69, respiratory rate 18, blood pressure 132/62, weight is 195 pounds.  Head and Neck:  Normocephalic, atraumatic skull.  There are no ocular or oral lesions.  There are no palpable cervical or supraclavicular lymph nodes.  Lungs:  Clear bilaterally. Cardiac:  Regular rate and rhythm with a normal S1 and S2.  There are no murmurs, rubs, or bruits.  Abdomen:  Soft.  He has good bowel sounds. His colostomy is in the left lower quadrant.  There is no fluid wave. There is no palpable hepatosplenomegaly.  Back:  No tenderness of the spine, ribs, or hips.  Extremities:  No clubbing, cyanosis, or edema. He has good range motion of his joints.  Skin:  No rashes, ecchymoses, or petechiae.  LABORATORY STUDIES:  White cell count 10.8, hemoglobin 12.7, hematocrit 39.6, platelet count 291. CEA is 1.0.  IMPRESSION:  Mr. Baskette is a nice 68 year old gentleman.  He has stage I adenocarcinoma of the rectum.  He is free of disease by over 10 years. He was diagnosed back in 2001.  Again, he would like to come back to see [place] for followup.  I do not see any issues with recurrence or even with a second  primary cancer.  We will plan to get him back to see [place] in another year.   ______________________________ Volanda Napoleon, M.D. PRE/MEDQ  D:  09/13/2013  T:  09/14/2013  Job:  [id number]

## 2014-05-09 DIAGNOSIS — Z23 Encounter for immunization: Secondary | ICD-10-CM | POA: Diagnosis not present

## 2014-09-10 ENCOUNTER — Ambulatory Visit (HOSPITAL_BASED_OUTPATIENT_CLINIC_OR_DEPARTMENT_OTHER): Payer: Medicare Other | Admitting: Hematology & Oncology

## 2014-09-10 ENCOUNTER — Other Ambulatory Visit (HOSPITAL_BASED_OUTPATIENT_CLINIC_OR_DEPARTMENT_OTHER): Payer: Medicare Other | Admitting: Lab

## 2014-09-10 ENCOUNTER — Encounter: Payer: Self-pay | Admitting: Hematology & Oncology

## 2014-09-10 VITALS — BP 123/52 | HR 69 | Temp 98.0°F | Resp 18 | Ht 71.0 in | Wt 197.0 lb

## 2014-09-10 DIAGNOSIS — Z85048 Personal history of other malignant neoplasm of rectum, rectosigmoid junction, and anus: Secondary | ICD-10-CM | POA: Diagnosis not present

## 2014-09-10 DIAGNOSIS — C20 Malignant neoplasm of rectum: Secondary | ICD-10-CM

## 2014-09-10 DIAGNOSIS — E875 Hyperkalemia: Secondary | ICD-10-CM

## 2014-09-10 DIAGNOSIS — G629 Polyneuropathy, unspecified: Secondary | ICD-10-CM

## 2014-09-10 LAB — CMP (CANCER CENTER ONLY)
ALBUMIN: 3.6 g/dL (ref 3.3–5.5)
ALT: 15 U/L (ref 10–47)
AST: 21 U/L (ref 11–38)
Alkaline Phosphatase: 46 U/L (ref 26–84)
BILIRUBIN TOTAL: 0.6 mg/dL (ref 0.20–1.60)
BUN: 15 mg/dL (ref 7–22)
CO2: 30 mEq/L (ref 18–33)
CREATININE: 1 mg/dL (ref 0.6–1.2)
Calcium: 9.4 mg/dL (ref 8.0–10.3)
Chloride: 102 mEq/L (ref 98–108)
GLUCOSE: 132 mg/dL — AB (ref 73–118)
Potassium: 4.6 mEq/L (ref 3.3–4.7)
SODIUM: 144 meq/L (ref 128–145)
TOTAL PROTEIN: 6.9 g/dL (ref 6.4–8.1)

## 2014-09-10 LAB — CBC WITH DIFFERENTIAL (CANCER CENTER ONLY)
BASO#: 0 10*3/uL (ref 0.0–0.2)
BASO%: 0.5 % (ref 0.0–2.0)
EOS ABS: 0.5 10*3/uL (ref 0.0–0.5)
EOS%: 5.6 % (ref 0.0–7.0)
HCT: 39.5 % (ref 38.7–49.9)
HGB: 12.8 g/dL — ABNORMAL LOW (ref 13.0–17.1)
LYMPH#: 2.3 10*3/uL (ref 0.9–3.3)
LYMPH%: 26.7 % (ref 14.0–48.0)
MCH: 27.6 pg — AB (ref 28.0–33.4)
MCHC: 32.4 g/dL (ref 32.0–35.9)
MCV: 85 fL (ref 82–98)
MONO#: 0.7 10*3/uL (ref 0.1–0.9)
MONO%: 8.8 % (ref 0.0–13.0)
NEUT%: 58.4 % (ref 40.0–80.0)
NEUTROS ABS: 4.9 10*3/uL (ref 1.5–6.5)
Platelets: 262 10*3/uL (ref 145–400)
RBC: 4.64 10*6/uL (ref 4.20–5.70)
RDW: 14.9 % (ref 11.1–15.7)
WBC: 8.5 10*3/uL (ref 4.0–10.0)

## 2014-09-10 NOTE — Progress Notes (Signed)
Hematology and Oncology Follow Up Visit  DMARION PERFECT [id number] 05-24-1946 69 y.o. 09/10/2014   Principle Diagnosis:   Stage I (T2N0M0) adenocarcinoma the rectum  Current Therapy:    Observation     Interim History:  Mr.  Leroy Jones is back for follow-up. I see him yearly. He's followed by the [place] system. He wants me sure that we see him so that he has "peace of mind".  His main problem is diabetes. He is insulin-dependent. He does good job with this.  He's had no health problems as far as he remembers. He had a good year last year.  He's had no problems with nausea or vomiting. He's had no issues with his colostomy. He's had no cough. He's had no leg swelling. He's had no rashes. He's had neuropathy. He has neuropathy in his feet to his ankles. This is been chronic. He has some tingling in his fingers, again, which is chronic.  Overall, his performance status is ECOG 0.  Medications:  Current outpatient prescriptions:  .  gabapentin (NEURONTIN) 300 MG capsule, Take 300 mg by mouth 3 (three) times daily., Disp: , Rfl:  .  insulin aspart (NOVOLOG) 100 UNIT/ML injection, Inject into the skin 3 (three) times daily with meals. < 60 4 UNITS    > 61   6 UNITS       OVER 260  14 UNITS, Disp: , Rfl:  .  insulin glargine (LANTUS) 100 UNIT/ML injection, Inject 32 Units into the skin at bedtime., Disp: , Rfl:  .  sertraline (ZOLOFT) 100 MG tablet, Take 100 mg by mouth daily., Disp: , Rfl:  .  Glucosamine HCl (GLUCOSAMINE PO), Take 1 tablet by mouth daily., Disp: , Rfl:  .  metFORMIN (GLUCOPHAGE) 1000 MG tablet, Take 1,000 mg by mouth 2 (two) times daily with a meal., Disp: , Rfl:   Allergies:  Allergies  Allergen Reactions  . Codeine Nausea And Vomiting  . Oxycodone Hcl Nausea And Vomiting    Past Medical History, Surgical history, Social history, and Family History were reviewed and updated.  Review of Systems: As above  Physical Exam:  height is 5\' 11"  (1.803 m) and weight is 197 lb  (89.359 kg). His oral temperature is 98 F (36.7 C). His blood pressure is 123/52 and his pulse is 69. His respiration is 18.   Well-developed and well-nourished white gentleman in no obvious distress. Head and neck exam shows no ocular or oral lesions. There are no palpable cervical or supraclavicular lymph nodes. Lungs are clear. Cardiac exam regular rate and rhythm with no murmurs, rubs or bruits. Abdomen is soft. He has good bowel sounds. He has a colostomy that is intact. This is in the right lower quadrant. There is no fluid wave. He has no palpable liver or spleen tip. Back exam shows no tenderness over the spine, ribs or hips. Extremities shows no clubbing, cyanosis or edema. Neurological exam shows the decreased sensation in his feet.  Lab Results  Component Value Date   WBC 8.5 09/10/2014   HGB 12.8* 09/10/2014   HCT 39.5 09/10/2014   MCV 85 09/10/2014   PLT 262 09/10/2014     Chemistry      Component Value Date/Time   NA 144 09/10/2014 1416   NA 144 09/11/2013 1418   K 4.6 09/10/2014 1416   K 5.0 09/11/2013 1418   CL 102 09/10/2014 1416   CL 106 09/11/2013 1418   CO2 30 09/10/2014 1416   CO2 30  09/11/2013 1418   BUN 15 09/10/2014 1416   BUN 11 09/11/2013 1418   CREATININE 1.0 09/10/2014 1416   CREATININE 0.88 09/11/2013 1418      Component Value Date/Time   CALCIUM 9.4 09/10/2014 1416   CALCIUM 9.9 09/11/2013 1418   ALKPHOS 46 09/10/2014 1416   ALKPHOS 54 09/11/2013 1418   AST 21 09/10/2014 1416   AST 13 09/11/2013 1418   ALT 15 09/10/2014 1416   ALT 10 09/11/2013 1418   BILITOT 0.60 09/10/2014 1416   BILITOT 0.3 09/11/2013 1418         Impression and Plan: [name] is 69 year old gentleman. He had a stage I rectal cancer diagnosed back in 2001. He's done well with this. He has no evidence of recurrence. He is cured from my point of view.  For now, I will plan to get him back yearly. He'll ice come back to see [place] yearly as it makes him feel  better.   Volanda Napoleon, MD 2/1/20163:45 PM

## 2014-11-08 DIAGNOSIS — H02833 Dermatochalasis of right eye, unspecified eyelid: Secondary | ICD-10-CM | POA: Diagnosis not present

## 2014-11-08 DIAGNOSIS — H25013 Cortical age-related cataract, bilateral: Secondary | ICD-10-CM | POA: Diagnosis not present

## 2014-11-08 DIAGNOSIS — H40013 Open angle with borderline findings, low risk, bilateral: Secondary | ICD-10-CM | POA: Diagnosis not present

## 2014-11-08 DIAGNOSIS — H2513 Age-related nuclear cataract, bilateral: Secondary | ICD-10-CM | POA: Diagnosis not present

## 2015-04-24 DIAGNOSIS — Z23 Encounter for immunization: Secondary | ICD-10-CM | POA: Diagnosis not present

## 2015-09-09 ENCOUNTER — Ambulatory Visit (HOSPITAL_BASED_OUTPATIENT_CLINIC_OR_DEPARTMENT_OTHER): Payer: Medicare Other | Admitting: Hematology & Oncology

## 2015-09-09 ENCOUNTER — Other Ambulatory Visit (HOSPITAL_BASED_OUTPATIENT_CLINIC_OR_DEPARTMENT_OTHER): Payer: Medicare Other

## 2015-09-09 ENCOUNTER — Encounter: Payer: Self-pay | Admitting: Hematology & Oncology

## 2015-09-09 VITALS — BP 106/54 | HR 72 | Temp 97.9°F | Resp 16 | Ht 71.0 in | Wt 190.0 lb

## 2015-09-09 DIAGNOSIS — C61 Malignant neoplasm of prostate: Secondary | ICD-10-CM | POA: Diagnosis not present

## 2015-09-09 DIAGNOSIS — E875 Hyperkalemia: Secondary | ICD-10-CM | POA: Diagnosis not present

## 2015-09-09 DIAGNOSIS — Z85048 Personal history of other malignant neoplasm of rectum, rectosigmoid junction, and anus: Secondary | ICD-10-CM

## 2015-09-09 DIAGNOSIS — C20 Malignant neoplasm of rectum: Secondary | ICD-10-CM

## 2015-09-09 LAB — COMPREHENSIVE METABOLIC PANEL (CC13)
ALT: 18 IU/L (ref 0–44)
AST (SGOT): 18 IU/L (ref 0–40)
Albumin, Serum: 4.1 g/dL (ref 3.5–4.8)
Albumin/Globulin Ratio: 1.6 (ref 1.1–2.5)
Alkaline Phosphatase, S: 60 IU/L (ref 39–117)
BUN/Creatinine Ratio: 22 (ref 10–22)
BUN: 17 mg/dL (ref 8–27)
Bilirubin Total: 0.3 mg/dL (ref 0.0–1.2)
CHLORIDE: 105 mmol/L (ref 96–106)
CO2: 26 mmol/L (ref 18–29)
Calcium, Ser: 9.7 mg/dL (ref 8.6–10.2)
Creatinine, Ser: 0.79 mg/dL (ref 0.76–1.27)
GFR calc Af Amer: 105 mL/min/{1.73_m2} (ref >59)
GFR calc non Af Amer: 91 mL/min/{1.73_m2} (ref >59)
GLUCOSE: 141 mg/dL — AB (ref 65–99)
Globulin, Total: 2.5 g/dL (ref 1.5–4.5)
POTASSIUM: 4.5 mmol/L (ref 3.5–5.2)
Sodium: 141 mmol/L (ref 134–144)
TOTAL PROTEIN: 6.6 g/dL (ref 6.0–8.5)

## 2015-09-09 LAB — CBC WITH DIFFERENTIAL (CANCER CENTER ONLY)
BASO#: 0 10*3/uL (ref 0.0–0.2)
BASO%: 0.5 % (ref 0.0–2.0)
EOS ABS: 0.3 10*3/uL (ref 0.0–0.5)
EOS%: 3.1 % (ref 0.0–7.0)
HCT: 42.2 % (ref 38.7–49.9)
HEMOGLOBIN: 13.9 g/dL (ref 13.0–17.1)
LYMPH#: 2.2 10*3/uL (ref 0.9–3.3)
LYMPH%: 25.9 % (ref 14.0–48.0)
MCH: 29.1 pg (ref 28.0–33.4)
MCHC: 32.9 g/dL (ref 32.0–35.9)
MCV: 88 fL (ref 82–98)
MONO#: 0.8 10*3/uL (ref 0.1–0.9)
MONO%: 9 % (ref 0.0–13.0)
NEUT%: 61.5 % (ref 40.0–80.0)
NEUTROS ABS: 5.2 10*3/uL (ref 1.5–6.5)
Platelets: 266 10*3/uL (ref 145–400)
RBC: 4.78 10*6/uL (ref 4.20–5.70)
RDW: 13.2 % (ref 11.1–15.7)
WBC: 8.5 10*3/uL (ref 4.0–10.0)

## 2015-09-09 NOTE — Progress Notes (Signed)
Hematology and Oncology Follow Up Visit  Leroy Jones [id number] 1946-06-12 70 y.o. 09/09/2015   Principle Diagnosis:   Stage I (T2N0M0) adenocarcinoma the rectum  Current Therapy:    Observation     Interim History:  Mr.  Jones is back for follow-up. I see him yearly. He's followed by the [place] system. He wants me sure that we see him so that he has "peace of mind".  His main problem is diabetes. He is insulin-dependent. He does good job with this.  He's had no health problems as far as he remembers. He had a good year last year.  His last CEA level was 1.0.  He's had no problems with nausea or vomiting. He's had no issues with his colostomy. He's had no cough. He's had no leg swelling. He's had no rashes. He's had neuropathy. He has neuropathy in his feet to his ankles. This is been chronic. He has some tingling in his fingers, again, which is chronic.  Overall, his performance status is ECOG 0.  Medications:  Current outpatient prescriptions:  .  gabapentin (NEURONTIN) 300 MG capsule, Take 300 mg by mouth 3 (three) times daily., Disp: , Rfl:  .  Glucosamine HCl (GLUCOSAMINE PO), Take 1 tablet by mouth daily., Disp: , Rfl:  .  insulin aspart (NOVOLOG) 100 UNIT/ML injection, Inject into the skin 3 (three) times daily with meals. < 60 4 UNITS    > 61   6 UNITS       OVER 260  14 UNITS, Disp: , Rfl:  .  insulin glargine (LANTUS) 100 UNIT/ML injection, Inject 32 Units into the skin at bedtime., Disp: , Rfl:  .  metFORMIN (GLUCOPHAGE) 1000 MG tablet, Take 1,000 mg by mouth 2 (two) times daily with a meal., Disp: , Rfl:  .  sertraline (ZOLOFT) 100 MG tablet, Take 100 mg by mouth daily., Disp: , Rfl:   Allergies:  Allergies  Allergen Reactions  . Codeine Nausea And Vomiting  . Oxycodone Hcl Nausea And Vomiting    Past Medical History, Surgical history, Social history, and Family History were reviewed and updated.  Review of Systems: As above  Physical Exam:  height is 5'  11" (1.803 m) and weight is 190 lb (86.183 kg). His oral temperature is 97.9 F (36.6 C). His blood pressure is 106/54 and his pulse is 72. His respiration is 16.   Well-developed and well-nourished white gentleman in no obvious distress. Head and neck exam shows no ocular or oral lesions. There are no palpable cervical or supraclavicular lymph nodes. Lungs are clear. Cardiac exam regular rate and rhythm with no murmurs, rubs or bruits. Abdomen is soft. He has good bowel sounds. He has a colostomy that is intact. This is in the right lower quadrant. There is no fluid wave. He has no palpable liver or spleen tip. Back exam shows no tenderness over the spine, ribs or hips. Extremities shows no clubbing, cyanosis or edema. Neurological exam shows the decreased sensation in his feet.  Lab Results  Component Value Date   WBC 8.5 09/09/2015   HGB 13.9 09/09/2015   HCT 42.2 09/09/2015   MCV 88 09/09/2015   PLT 266 09/09/2015     Chemistry      Component Value Date/Time   NA 144 09/10/2014 1416   NA 144 09/11/2013 1418   K 4.6 09/10/2014 1416   K 5.0 09/11/2013 1418   CL 102 09/10/2014 1416   CL 106 09/11/2013 1418   CO2  30 09/10/2014 1416   CO2 30 09/11/2013 1418   BUN 15 09/10/2014 1416   BUN 11 09/11/2013 1418   CREATININE 1.0 09/10/2014 1416   CREATININE 0.88 09/11/2013 1418      Component Value Date/Time   CALCIUM 9.4 09/10/2014 1416   CALCIUM 9.9 09/11/2013 1418   ALKPHOS 46 09/10/2014 1416   ALKPHOS 54 09/11/2013 1418   AST 21 09/10/2014 1416   AST 13 09/11/2013 1418   ALT 15 09/10/2014 1416   ALT 10 09/11/2013 1418   BILITOT 0.60 09/10/2014 1416   BILITOT 0.3 09/11/2013 1418         Impression and Plan: Leroy Jones is 70 year old gentleman. He had a stage I rectal cancer diagnosed back in 2001. He's done well with this. He has no evidence of recurrence. He is cured from my point of view.  For now, I will plan to get him back yearly. He enjoys coming back to see [place]  yearly as it makes him feel better. Apparently, his [place] doctors just do not examine him but just talk to him. He feels much more reassured that we actually examine him and touch him during the examination.   Volanda Napoleon, MD 1/30/20173:27 PM

## 2016-03-06 DIAGNOSIS — G8929 Other chronic pain: Secondary | ICD-10-CM | POA: Insufficient documentation

## 2016-03-06 DIAGNOSIS — M25561 Pain in right knee: Secondary | ICD-10-CM

## 2016-05-14 DIAGNOSIS — Z23 Encounter for immunization: Secondary | ICD-10-CM | POA: Diagnosis not present

## 2016-09-07 ENCOUNTER — Other Ambulatory Visit (HOSPITAL_BASED_OUTPATIENT_CLINIC_OR_DEPARTMENT_OTHER): Payer: Medicare Other

## 2016-09-07 ENCOUNTER — Ambulatory Visit (HOSPITAL_BASED_OUTPATIENT_CLINIC_OR_DEPARTMENT_OTHER): Payer: Medicare Other | Admitting: Hematology & Oncology

## 2016-09-07 VITALS — BP 110/55 | HR 69 | Temp 98.0°F | Resp 16 | Wt 197.0 lb

## 2016-09-07 DIAGNOSIS — G629 Polyneuropathy, unspecified: Secondary | ICD-10-CM

## 2016-09-07 DIAGNOSIS — Z85048 Personal history of other malignant neoplasm of rectum, rectosigmoid junction, and anus: Secondary | ICD-10-CM

## 2016-09-07 DIAGNOSIS — C20 Malignant neoplasm of rectum: Secondary | ICD-10-CM

## 2016-09-07 LAB — CBC WITH DIFFERENTIAL (CANCER CENTER ONLY)
BASO#: 0 10*3/uL (ref 0.0–0.2)
BASO%: 0.3 % (ref 0.0–2.0)
EOS%: 3.7 % (ref 0.0–7.0)
Eosinophils Absolute: 0.3 10*3/uL (ref 0.0–0.5)
HEMATOCRIT: 41.8 % (ref 38.7–49.9)
HGB: 13.6 g/dL (ref 13.0–17.1)
LYMPH#: 2.3 10*3/uL (ref 0.9–3.3)
LYMPH%: 25.5 % (ref 14.0–48.0)
MCH: 29.2 pg (ref 28.0–33.4)
MCHC: 32.5 g/dL (ref 32.0–35.9)
MCV: 90 fL (ref 82–98)
MONO#: 0.7 10*3/uL (ref 0.1–0.9)
MONO%: 7.4 % (ref 0.0–13.0)
NEUT#: 5.6 10*3/uL (ref 1.5–6.5)
NEUT%: 63.1 % (ref 40.0–80.0)
Platelets: 262 10*3/uL (ref 145–400)
RBC: 4.65 10*6/uL (ref 4.20–5.70)
RDW: 13.6 % (ref 11.1–15.7)
WBC: 8.9 10*3/uL (ref 4.0–10.0)

## 2016-09-07 LAB — CMP (CANCER CENTER ONLY)
ALBUMIN: 3.8 g/dL (ref 3.3–5.5)
ALK PHOS: 56 U/L (ref 26–84)
ALT: 32 U/L (ref 10–47)
AST: 29 U/L (ref 11–38)
BUN, Bld: 14 mg/dL (ref 7–22)
CO2: 29 meq/L (ref 18–33)
CREATININE: 0.8 mg/dL (ref 0.6–1.2)
Calcium: 9.4 mg/dL (ref 8.0–10.3)
Chloride: 104 mEq/L (ref 98–108)
Glucose, Bld: 187 mg/dL — ABNORMAL HIGH (ref 73–118)
Potassium: 4.3 mEq/L (ref 3.3–4.7)
Sodium: 143 mEq/L (ref 128–145)
TOTAL PROTEIN: 6.7 g/dL (ref 6.4–8.1)
Total Bilirubin: 0.6 mg/dl (ref 0.20–1.60)

## 2016-09-07 NOTE — Progress Notes (Signed)
Hematology and Oncology Follow Up Visit  Leroy Jones [id number] Mar 26, 1946 71 y.o. 09/07/2016   Principle Diagnosis:   Stage I (T2N0M0) adenocarcinoma the rectum  Current Therapy:    Observation     Interim History:  Mr.  Jones is back for follow-up. I see him yearly. He's followed by the [place] system. He wants me sure that we see him so that he has "peace of mind".  His main problem is diabetes. He is insulin-dependent. He does good job with this.  He is having some problems with his right knee. He saw a doctor at [facility]. He had an injection which she says she will never have again. It sounds like this does not go all that well for him.  He has had no problems with the flu. He's had no pneumonia. He's had no rashes.  His last CEA level was 1.0.  He's had no problems with nausea or vomiting. He's had no issues with his colostomy. He's had no cough. He's had no leg swelling. He's had no rashes. He's had neuropathy. He has neuropathy in his feet to his ankles. This is been chronic. He has some tingling in his fingers, again, which is chronic.  Overall, his performance status is ECOG 0.  Medications:  Current Outpatient Prescriptions:  .  gabapentin (NEURONTIN) 300 MG capsule, Take 300 mg by mouth 3 (three) times daily., Disp: , Rfl:  .  Glucosamine HCl (GLUCOSAMINE PO), Take 1 tablet by mouth daily., Disp: , Rfl:  .  insulin aspart (NOVOLOG) 100 UNIT/ML injection, Inject into the skin 3 (three) times daily with meals. < 60 4 UNITS    > 61   6 UNITS       OVER 260  14 UNITS, Disp: , Rfl:  .  insulin glargine (LANTUS) 100 UNIT/ML injection, Inject 32 Units into the skin at bedtime., Disp: , Rfl:  .  metFORMIN (GLUCOPHAGE) 1000 MG tablet, Take 1,000 mg by mouth 2 (two) times daily with a meal., Disp: , Rfl:  .  sertraline (ZOLOFT) 100 MG tablet, Take 100 mg by mouth daily., Disp: , Rfl:   Allergies:  Allergies  Allergen Reactions  . Codeine Nausea And Vomiting  .  Oxycodone Hcl Nausea And Vomiting  . Oxycodone Hcl Nausea And Vomiting    Past Medical History, Surgical history, Social history, and Family History were reviewed and updated.  Review of Systems: As above  Physical Exam:  weight is 197 lb (89.4 kg). His oral temperature is 98 F (36.7 C). His blood pressure is 110/55 (abnormal) and his pulse is 69. His respiration is 16 and oxygen saturation is 99%.   Well-developed and well-nourished white gentleman in no obvious distress. Head and neck exam shows no ocular or oral lesions. There are no palpable cervical or supraclavicular lymph nodes. Lungs are clear. Cardiac exam regular rate and rhythm with no murmurs, rubs or bruits. Abdomen is soft. He has good bowel sounds. He has a colostomy that is intact. This is in the right lower quadrant. There is no fluid wave. He has no palpable liver or spleen tip. Back exam shows no tenderness over the spine, ribs or hips. Extremities shows no clubbing, cyanosis or edema. Neurological exam shows the decreased sensation in his feet.  Lab Results  Component Value Date   WBC 8.9 09/07/2016   HGB 13.6 09/07/2016   HCT 41.8 09/07/2016   MCV 90 09/07/2016   PLT 262 09/07/2016     Chemistry  Component Value Date/Time   NA 143 09/07/2016 1427   K 4.3 09/07/2016 1427   CL 104 09/07/2016 1427   CO2 29 09/07/2016 1427   BUN 14 09/07/2016 1427   CREATININE 0.8 09/07/2016 1427      Component Value Date/Time   CALCIUM 9.4 09/07/2016 1427   ALKPHOS 56 09/07/2016 1427   AST 29 09/07/2016 1427   ALT 32 09/07/2016 1427   BILITOT 0.60 09/07/2016 1427         Impression and Plan: Leroy Jones is 72 year old gentleman. He had a stage I rectal cancer diagnosed back in 2001. He's done well with this. He has no evidence of recurrence. He is cured from my point of view.  For now, I will plan to get him back yearly. He enjoys coming back to see [place] yearly as it makes him feel better.  Hopefully, his diabetes  will do okay. I think this is his biggest problem and the limitation of his life. His sounds like he is doing pretty good with this.  Volanda Napoleon, MD 1/29/20183:26 PM

## 2016-09-08 LAB — CEA (IN HOUSE-CHCC): CEA (CHCC-IN HOUSE): 1.72 ng/mL (ref 0.00–5.00)

## 2016-09-08 LAB — LACTATE DEHYDROGENASE: LDH: 160 U/L (ref 125–245)

## 2016-10-26 ENCOUNTER — Encounter: Payer: Self-pay | Admitting: *Deleted

## 2017-03-08 ENCOUNTER — Other Ambulatory Visit: Payer: Self-pay | Admitting: Family

## 2017-09-06 ENCOUNTER — Other Ambulatory Visit: Payer: Self-pay

## 2017-09-06 ENCOUNTER — Inpatient Hospital Stay (HOSPITAL_BASED_OUTPATIENT_CLINIC_OR_DEPARTMENT_OTHER): Payer: Medicare Other | Admitting: Family

## 2017-09-06 ENCOUNTER — Inpatient Hospital Stay: Payer: Medicare Other | Attending: Hematology & Oncology

## 2017-09-06 ENCOUNTER — Encounter: Payer: Self-pay | Admitting: Family

## 2017-09-06 VITALS — BP 131/67 | HR 74 | Temp 98.1°F | Resp 18 | Wt 200.4 lb

## 2017-09-06 DIAGNOSIS — Z85048 Personal history of other malignant neoplasm of rectum, rectosigmoid junction, and anus: Secondary | ICD-10-CM | POA: Insufficient documentation

## 2017-09-06 DIAGNOSIS — C20 Malignant neoplasm of rectum: Secondary | ICD-10-CM

## 2017-09-06 DIAGNOSIS — Z794 Long term (current) use of insulin: Secondary | ICD-10-CM

## 2017-09-06 DIAGNOSIS — Z79899 Other long term (current) drug therapy: Secondary | ICD-10-CM

## 2017-09-06 DIAGNOSIS — G629 Polyneuropathy, unspecified: Secondary | ICD-10-CM | POA: Insufficient documentation

## 2017-09-06 LAB — CBC WITH DIFFERENTIAL (CANCER CENTER ONLY)
BASOS PCT: 1 %
Basophils Absolute: 0.1 10*3/uL (ref 0.0–0.1)
EOS ABS: 0.7 10*3/uL — AB (ref 0.0–0.5)
Eosinophils Relative: 7 %
HEMATOCRIT: 42.4 % (ref 38.7–49.9)
Hemoglobin: 13.9 g/dL (ref 13.0–17.1)
Lymphocytes Relative: 28 %
Lymphs Abs: 2.5 10*3/uL (ref 0.9–3.3)
MCH: 29.2 pg (ref 28.0–33.4)
MCHC: 32.8 g/dL (ref 32.0–35.9)
MCV: 89.1 fL (ref 82.0–98.0)
Monocytes Absolute: 0.7 10*3/uL (ref 0.1–0.9)
Monocytes Relative: 7 %
NEUTROS ABS: 5.1 10*3/uL (ref 1.5–6.5)
NEUTROS PCT: 57 %
Platelet Count: 263 10*3/uL (ref 140–400)
RBC: 4.76 MIL/uL (ref 4.20–5.70)
RDW: 13 % (ref 11.1–15.7)
WBC Count: 9 10*3/uL (ref 4.0–10.3)

## 2017-09-06 LAB — CMP (CANCER CENTER ONLY)
ALK PHOS: 60 U/L (ref 40–150)
ALT: 16 U/L (ref 0–55)
AST: 21 U/L (ref 5–34)
Albumin: 3.9 g/dL (ref 3.5–5.0)
Anion gap: 10 (ref 3–11)
BILIRUBIN TOTAL: 0.4 mg/dL (ref 0.2–1.2)
BUN: 16 mg/dL (ref 7–26)
CO2: 28 mmol/L (ref 22–29)
Calcium: 9.6 mg/dL (ref 8.4–10.4)
Chloride: 104 mmol/L (ref 98–109)
Creatinine: 0.95 mg/dL (ref 0.70–1.30)
GFR, Est AFR Am: >60 mL/min (ref >60)
GFR, Estimated: >60 mL/min (ref >60)
Glucose, Bld: 136 mg/dL (ref 70–140)
POTASSIUM: 4.8 mmol/L (ref 3.5–5.1)
SODIUM: 142 mmol/L (ref 136–145)
TOTAL PROTEIN: 7.3 g/dL (ref 6.4–8.3)

## 2017-09-06 NOTE — Progress Notes (Signed)
Hematology and Oncology Follow Up Visit  Leroy Jones [id number] [birth year] 72 y.o. 09/06/2017   Principle Diagnosis:  Stage I (T2N0M0) adenocarcinoma of the rectum  Current Therapy:   Observation   Interim History:  Leroy Jones is here today for his annual follow-up. 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  No fever, chills, n/v, cough, rash, dizziness, SOB, chest pain, palpitations, abdominal pain or changes in bowel or bladder habits.  His ostomy is working well. CEA last year was 1.72. Today's result is pending.  No bleeding, bruising or petechiae. No lymphadenopathy found on exam.  No swelling or tenderness in his extremities. The neuropathy in his hands and feet is unchanged.  He has maintained a good appetite and is staying well hydrated. His weight is stable.   ECOG Performance Status: 0 - Asymptomatic  Medications:  Allergies as of 09/06/2017      Reactions   Codeine Nausea And Vomiting   Oxycodone Hcl Nausea And Vomiting   Oxycodone Hcl Nausea And Vomiting      Medication List        Accurate as of 09/06/17  2:49 PM. Always use your most recent med list.          gabapentin 300 MG capsule Commonly known as:  NEURONTIN Take 300 mg by mouth 3 (three) times daily.   GLUCOSAMINE PO Take 1 tablet by mouth daily.   insulin aspart 100 UNIT/ML injection Commonly known as:  novoLOG Inject into the skin 3 (three) times daily with meals. < 60 4 UNITS    > 61   6 UNITS       OVER 260  14 UNITS   insulin glargine 100 UNIT/ML injection Commonly known as:  LANTUS Inject 22 Units into the skin at bedtime.   metFORMIN 1000 MG tablet Commonly known as:  GLUCOPHAGE Take 1,000 mg by mouth 2 (two) times daily with a meal.   sertraline 100 MG tablet Commonly known as:  ZOLOFT Take 100 mg by mouth daily.       Allergies:  Allergies  Allergen Reactions  . Codeine Nausea And Vomiting  . Oxycodone Hcl Nausea And  Vomiting  . Oxycodone Hcl Nausea And Vomiting    Past Medical History, Surgical history, Social history, and Family History were reviewed and updated.  Review of Systems: All other 10 point review of systems is negative.   Physical Exam:  weight is 200 lb 6.4 oz (90.9 kg). His oral temperature is 98.1 F (36.7 C). His blood pressure is 131/67 and his pulse is 74. His respiration is 18 and oxygen saturation is 99%.   Wt Readings from Last 3 Encounters:  09/06/17 200 lb 6.4 oz (90.9 kg)  09/07/16 197 lb (89.4 kg)  09/09/15 190 lb (86.2 kg)    Ocular: Sclerae unicteric, pupils equal, round and reactive to light Ear-nose-throat: Oropharynx clear, dentition fair Lymphatic: No cervical, supraclavicular or axillary adenopathy Lungs no rales or rhonchi, good excursion bilaterally Heart regular rate and rhythm, no murmur appreciated Abd soft, nontender, positive bowel sounds, no liver or spleen tip palpated on exam, no fluid wave  MSK no focal spinal tenderness, no joint edema Neuro: non-focal, well-oriented, appropriate affect Breasts: Deferred   Lab Results  Component Value Date   WBC 9.0 09/06/2017   HGB 13.6 09/07/2016   HCT 42.4 09/06/2017   MCV 89.1 09/06/2017   PLT 263 09/06/2017   No results found for:  FERRITIN, IRON, TIBC, UIBC, IRONPCTSAT Lab Results  Component Value Date   RBC 4.76 09/06/2017   No results found for: KPAFRELGTCHN, LAMBDASER, KAPLAMBRATIO No results found for: IGGSERUM, IGA, IGMSERUM No results found for: Odetta Pink, SPEI   Chemistry      Component Value Date/Time   NA 143 09/07/2016 1427   K 4.3 09/07/2016 1427   CL 104 09/07/2016 1427   CO2 29 09/07/2016 1427   BUN 14 09/07/2016 1427   CREATININE 0.8 09/07/2016 1427      Component Value Date/Time   CALCIUM 9.4 09/07/2016 1427   ALKPHOS 56 09/07/2016 1427   AST 29 09/07/2016 1427   ALT 32 09/07/2016 1427   BILITOT 0.60 09/07/2016 1427       Impression and Plan: Leroy Jones is a very pleasant 72 yo caucasian gentleman with stage I rectal cancer diagnosed back in 2001. He has done well and has no complaints at this time.  We will continue to follow along with him annually.  He will contact our office with any questions or concerns. We can certainly see him sooner if need be.   Laverna Peace, NP 1/28/20192:49 PM

## 2017-09-07 LAB — CEA (IN HOUSE-CHCC): CEA (CHCC-IN HOUSE): 1.4 ng/mL (ref 0.00–5.00)

## 2017-09-08 ENCOUNTER — Telehealth: Payer: Self-pay | Admitting: *Deleted

## 2017-09-08 NOTE — Telephone Encounter (Addendum)
Patient is aware of results  ----- Message from Volanda Napoleon, MD sent at 09/07/2017  5:27 PM EST ----- Ca;ll - CEA tumor marker is normal!!  Laurey Arrow

## 2018-07-05 ENCOUNTER — Other Ambulatory Visit (HOSPITAL_COMMUNITY): Payer: Self-pay | Admitting: Orthopedic Surgery

## 2018-07-05 DIAGNOSIS — Z96651 Presence of right artificial knee joint: Secondary | ICD-10-CM

## 2018-07-14 ENCOUNTER — Encounter (HOSPITAL_COMMUNITY)
Admission: RE | Admit: 2018-07-14 | Discharge: 2018-07-14 | Disposition: A | Discharge status: Home / Self Care | Payer: No Typology Code available for payment source | Source: Ambulatory Visit | Attending: Orthopedic Surgery | Admitting: Orthopedic Surgery

## 2018-07-14 DIAGNOSIS — Z96651 Presence of right artificial knee joint: Secondary | ICD-10-CM | POA: Insufficient documentation

## 2018-07-14 MED ORDER — TECHNETIUM TC 99M MEDRONATE IV KIT
20.6000 | PACK | Freq: Once | INTRAVENOUS | Status: AC | PRN
  Administered 2018-07-14: 20.6 via INTRAVENOUS

## 2018-09-05 ENCOUNTER — Other Ambulatory Visit: Payer: Self-pay

## 2018-09-05 ENCOUNTER — Encounter: Payer: Self-pay | Admitting: Hematology & Oncology

## 2018-09-05 ENCOUNTER — Inpatient Hospital Stay: Payer: Medicare Other

## 2018-09-05 ENCOUNTER — Inpatient Hospital Stay: Payer: Medicare Other | Attending: Hematology & Oncology | Admitting: Hematology & Oncology

## 2018-09-05 VITALS — BP 131/63 | HR 72 | Temp 98.6°F | Resp 18 | Wt 189.0 lb

## 2018-09-05 DIAGNOSIS — Z85048 Personal history of other malignant neoplasm of rectum, rectosigmoid junction, and anus: Secondary | ICD-10-CM | POA: Insufficient documentation

## 2018-09-05 DIAGNOSIS — C20 Malignant neoplasm of rectum: Secondary | ICD-10-CM

## 2018-09-05 DIAGNOSIS — E119 Type 2 diabetes mellitus without complications: Secondary | ICD-10-CM | POA: Diagnosis not present

## 2018-09-05 DIAGNOSIS — Z933 Colostomy status: Secondary | ICD-10-CM

## 2018-09-05 LAB — CMP (CANCER CENTER ONLY)
ALK PHOS: 56 U/L (ref 38–126)
ALT: 12 U/L (ref 0–44)
ANION GAP: 7 (ref 5–15)
AST: 14 U/L — ABNORMAL LOW (ref 15–41)
Albumin: 4.5 g/dL (ref 3.5–5.0)
BILIRUBIN TOTAL: 0.3 mg/dL (ref 0.3–1.2)
BUN: 18 mg/dL (ref 8–23)
CALCIUM: 10 mg/dL (ref 8.9–10.3)
CO2: 30 mmol/L (ref 22–32)
Chloride: 105 mmol/L (ref 98–111)
Creatinine: 0.78 mg/dL (ref 0.61–1.24)
GFR, Estimated: >60 mL/min (ref >60)
Glucose, Bld: 141 mg/dL — ABNORMAL HIGH (ref 70–99)
Potassium: 4.7 mmol/L (ref 3.5–5.1)
SODIUM: 142 mmol/L (ref 135–145)
TOTAL PROTEIN: 6.9 g/dL (ref 6.5–8.1)

## 2018-09-05 LAB — CBC WITH DIFFERENTIAL (CANCER CENTER ONLY)
Abs Immature Granulocytes: 0.03 10*3/uL (ref 0.00–0.07)
BASOS ABS: 0.1 10*3/uL (ref 0.0–0.1)
BASOS PCT: 1 %
EOS ABS: 0.6 10*3/uL — AB (ref 0.0–0.5)
Eosinophils Relative: 6 %
HCT: 40.6 % (ref 39.0–52.0)
HEMOGLOBIN: 13 g/dL (ref 13.0–17.0)
IMMATURE GRANULOCYTES: 0 %
LYMPHS ABS: 2.7 10*3/uL (ref 0.7–4.0)
Lymphocytes Relative: 28 %
MCH: 29.7 pg (ref 26.0–34.0)
MCHC: 32 g/dL (ref 30.0–36.0)
MCV: 92.9 fL (ref 80.0–100.0)
MONOS PCT: 8 %
Monocytes Absolute: 0.8 10*3/uL (ref 0.1–1.0)
NEUTROS PCT: 57 %
NRBC: 0 % (ref 0.0–0.2)
Neutro Abs: 5.6 10*3/uL (ref 1.7–7.7)
Platelet Count: 265 10*3/uL (ref 150–400)
RBC: 4.37 MIL/uL (ref 4.22–5.81)
RDW: 13.3 % (ref 11.5–15.5)
WBC Count: 9.7 10*3/uL (ref 4.0–10.5)

## 2018-09-05 NOTE — Progress Notes (Signed)
Hematology and Oncology Follow Up Visit  Leroy Jones [id number] 05-11-1946 73 y.o. 09/05/2018   Principle Diagnosis:  Stage I (T2N0M0) adenocarcinoma of the rectum  Current Therapy:   Observation   Interim History:  Mr. Leroy Jones is here today for his annual follow-up.  He is doing quite well.  He is very happy about his new diabetes detector.  This is much more patient friendly for him.  There is no wires.  It just works like a Nutritional therapist.  It is now been 19 years since we first saw him.  His last CEA from year ago was 1.4.  He is having problems with his right knee.  He is seeing the orthopedist for this.  Had a bone scan that was done on 07/14/2018.  This showed diffuse symmetric uptake around the right knee.  No findings were suspicious for loosening or fracture.  He had moderate degenerative changes around the left knee.  His appetite is doing well.  He has had no nausea or vomiting.  There is been no change in bowel or bladder habits.  He has a colostomy that works well.  Overall, his performance status is ECOG 0  Medications:  Allergies as of 09/05/2018      Reactions   Codeine Nausea And Vomiting   Oxycodone Hcl Nausea And Vomiting   Oxycodone Hcl Nausea And Vomiting      Medication List       Accurate as of September 05, 2018  3:56 PM. Always use your most recent med list.        gabapentin 300 MG capsule Commonly known as:  NEURONTIN Take 300 mg by mouth 3 (three) times daily.   GLUCOSAMINE PO Take 1 tablet by mouth daily.   insulin aspart 100 UNIT/ML injection Commonly known as:  novoLOG Inject into the skin 3 (three) times daily with meals. < 60 4 UNITS    > 61   6 UNITS       OVER 260  14 UNITS   insulin glargine 100 UNIT/ML injection Commonly known as:  LANTUS Inject 22 Units into the skin at bedtime.   metFORMIN 1000 MG tablet Commonly known as:  GLUCOPHAGE Take 1,000 mg by mouth 2 (two) times daily with a meal.   sertraline 100 MG tablet Commonly known  as:  ZOLOFT Take 100 mg by mouth daily.       Allergies:  Allergies  Allergen Reactions  . Codeine Nausea And Vomiting  . Oxycodone Hcl Nausea And Vomiting  . Oxycodone Hcl Nausea And Vomiting    Past Medical History, Surgical history, Social history, and Family History were reviewed and updated.  Review of Systems: Review of Systems  Constitutional: Negative.   HENT: Negative.   Eyes: Negative.   Respiratory: Negative.   Cardiovascular: Negative.   Gastrointestinal: Negative.   Genitourinary: Negative.   Musculoskeletal: Negative.   Skin: Negative.   Neurological: Negative.   Endo/Heme/Allergies: Negative.   Psychiatric/Behavioral: Negative.    Marland Kitchen   Physical Exam:  weight is 189 lb (85.7 kg). His oral temperature is 98.6 F (37 C). His blood pressure is 131/63 and his pulse is 72. His respiration is 18 and oxygen saturation is 99%.   Wt Readings from Last 3 Encounters:  09/05/18 189 lb (85.7 kg)  09/06/17 200 lb 6.4 oz (90.9 kg)  09/07/16 197 lb (89.4 kg)    Physical Exam Vitals signs reviewed.  HENT:     Head: Normocephalic and atraumatic.  Eyes:  Pupils: Pupils are equal, round, and reactive to light.  Neck:     Musculoskeletal: Normal range of motion.  Cardiovascular:     Rate and Rhythm: Normal rate and regular rhythm.     Heart sounds: Normal heart sounds.  Pulmonary:     Effort: Pulmonary effort is normal.     Breath sounds: Normal breath sounds.  Abdominal:     General: Bowel sounds are normal.     Palpations: Abdomen is soft.  Musculoskeletal: Normal range of motion.        General: No tenderness or deformity.  Lymphadenopathy:     Cervical: No cervical adenopathy.  Skin:    General: Skin is warm and dry.     Findings: No erythema or rash.  Neurological:     Mental Status: He is alert and oriented to person, place, and time.  Psychiatric:        Behavior: Behavior normal.        Thought Content: Thought content normal.         Judgment: Judgment normal.      Lab Results  Component Value Date   WBC 9.7 09/05/2018   HGB 13.0 09/05/2018   HCT 40.6 09/05/2018   MCV 92.9 09/05/2018   PLT 265 09/05/2018   No results found for: FERRITIN, IRON, TIBC, UIBC, IRONPCTSAT Lab Results  Component Value Date   RBC 4.37 09/05/2018   No results found for: KPAFRELGTCHN, LAMBDASER, KAPLAMBRATIO No results found for: IGGSERUM, IGA, IGMSERUM No results found for: Odetta Pink, SPEI   Chemistry      Component Value Date/Time   NA 142 09/05/2018 1416   NA 143 09/07/2016 1427   K 4.7 09/05/2018 1416   K 4.3 09/07/2016 1427   CL 105 09/05/2018 1416   CL 104 09/07/2016 1427   CO2 30 09/05/2018 1416   CO2 29 09/07/2016 1427   BUN 18 09/05/2018 1416   BUN 14 09/07/2016 1427   CREATININE 0.78 09/05/2018 1416   CREATININE 0.8 09/07/2016 1427      Component Value Date/Time   CALCIUM 10.0 09/05/2018 1416   CALCIUM 9.4 09/07/2016 1427   ALKPHOS 56 09/05/2018 1416   ALKPHOS 56 09/07/2016 1427   AST 14 (L) 09/05/2018 1416   ALT 12 09/05/2018 1416   ALT 32 09/07/2016 1427   BILITOT 0.3 09/05/2018 1416      Impression and Plan: Mr. Leroy Jones is a very pleasant 73 yo caucasian gentleman with stage I rectal cancer diagnosed back in 2001.   He has done well and has no complaints at this time.   We will continue to follow along with him annually.   He will contact our office with any questions or concerns. We can certainly see him sooner if need be.   Volanda Napoleon, MD 1/27/20203:56 PM

## 2018-09-06 LAB — CEA (IN HOUSE-CHCC): CEA (CHCC-IN HOUSE): 1.97 ng/mL (ref 0.00–5.00)

## 2019-04-25 ENCOUNTER — Telehealth: Payer: Self-pay | Admitting: *Deleted

## 2019-04-25 NOTE — Telephone Encounter (Signed)
Message received from patient's wife requesting for lab work to be done on patient d/t her concerns of pt.'s recent weight loss.  Call placed back to patient's wife, Leroy Jones to instruct her to call pt.'s PCP regarding pt.'s weight loss and office visit.  Leroy Jones states that pt goes to the [facility] in [place], had blood work drawn last week and that pt can not be seen until November at the [place]. Dr. Marin Olp notified and order received for patient to come in this week for labs and to see Sarah.  Pt.'s wife notified of MD orders.  Message sent to scheduling.

## 2019-04-26 ENCOUNTER — Inpatient Hospital Stay: Payer: Medicare Other | Attending: Hematology & Oncology

## 2019-04-26 ENCOUNTER — Inpatient Hospital Stay (HOSPITAL_BASED_OUTPATIENT_CLINIC_OR_DEPARTMENT_OTHER): Payer: Medicare Other | Admitting: Family

## 2019-04-26 ENCOUNTER — Encounter: Payer: Self-pay | Admitting: Family

## 2019-04-26 ENCOUNTER — Other Ambulatory Visit: Payer: Self-pay

## 2019-04-26 VITALS — BP 152/99 | HR 76 | Temp 98.6°F | Resp 18 | Ht 68.0 in | Wt 175.0 lb

## 2019-04-26 DIAGNOSIS — R200 Anesthesia of skin: Secondary | ICD-10-CM | POA: Diagnosis not present

## 2019-04-26 DIAGNOSIS — R202 Paresthesia of skin: Secondary | ICD-10-CM | POA: Insufficient documentation

## 2019-04-26 DIAGNOSIS — Z85048 Personal history of other malignant neoplasm of rectum, rectosigmoid junction, and anus: Secondary | ICD-10-CM | POA: Diagnosis not present

## 2019-04-26 DIAGNOSIS — Z79899 Other long term (current) drug therapy: Secondary | ICD-10-CM | POA: Diagnosis not present

## 2019-04-26 DIAGNOSIS — Z794 Long term (current) use of insulin: Secondary | ICD-10-CM | POA: Diagnosis not present

## 2019-04-26 DIAGNOSIS — Z7984 Long term (current) use of oral hypoglycemic drugs: Secondary | ICD-10-CM | POA: Diagnosis not present

## 2019-04-26 DIAGNOSIS — C20 Malignant neoplasm of rectum: Secondary | ICD-10-CM

## 2019-04-26 DIAGNOSIS — E1165 Type 2 diabetes mellitus with hyperglycemia: Secondary | ICD-10-CM | POA: Diagnosis not present

## 2019-04-26 LAB — CMP (CANCER CENTER ONLY)
ALT: 13 U/L (ref 0–44)
AST: 18 U/L (ref 15–41)
Albumin: 4.2 g/dL (ref 3.5–5.0)
Alkaline Phosphatase: 56 U/L (ref 38–126)
Anion gap: 9 (ref 5–15)
BUN: 19 mg/dL (ref 8–23)
CO2: 26 mmol/L (ref 22–32)
Calcium: 9.8 mg/dL (ref 8.9–10.3)
Chloride: 104 mmol/L (ref 98–111)
Creatinine: 1.06 mg/dL (ref 0.61–1.24)
GFR, Est AFR Am: >60 mL/min (ref >60)
GFR, Estimated: >60 mL/min (ref >60)
Glucose, Bld: 225 mg/dL — ABNORMAL HIGH (ref 70–99)
Potassium: 5 mmol/L (ref 3.5–5.1)
Sodium: 139 mmol/L (ref 135–145)
Total Bilirubin: 0.5 mg/dL (ref 0.3–1.2)
Total Protein: 6.5 g/dL (ref 6.5–8.1)

## 2019-04-26 LAB — CBC WITH DIFFERENTIAL (CANCER CENTER ONLY)
Abs Immature Granulocytes: 0.04 10*3/uL (ref 0.00–0.07)
Basophils Absolute: 0.1 10*3/uL (ref 0.0–0.1)
Basophils Relative: 1 %
Eosinophils Absolute: 0.2 10*3/uL (ref 0.0–0.5)
Eosinophils Relative: 2 %
HCT: 39.1 % (ref 39.0–52.0)
Hemoglobin: 12.7 g/dL — ABNORMAL LOW (ref 13.0–17.0)
Immature Granulocytes: 0 %
Lymphocytes Relative: 20 %
Lymphs Abs: 2.1 10*3/uL (ref 0.7–4.0)
MCH: 29.5 pg (ref 26.0–34.0)
MCHC: 32.5 g/dL (ref 30.0–36.0)
MCV: 90.9 fL (ref 80.0–100.0)
Monocytes Absolute: 0.6 10*3/uL (ref 0.1–1.0)
Monocytes Relative: 6 %
Neutro Abs: 7.7 10*3/uL (ref 1.7–7.7)
Neutrophils Relative %: 71 %
Platelet Count: 276 10*3/uL (ref 150–400)
RBC: 4.3 MIL/uL (ref 4.22–5.81)
RDW: 13.3 % (ref 11.5–15.5)
WBC Count: 10.7 10*3/uL — ABNORMAL HIGH (ref 4.0–10.5)
nRBC: 0 % (ref 0.0–0.2)

## 2019-04-26 NOTE — Progress Notes (Signed)
Hematology and Oncology Follow Up Visit  DRAY LEREW [id number] 09-19-1945 73 y.o. 04/26/2019   Principle Diagnosis:  Stage I (T2N0M0) adenocarcinoma of the rectum  Current Therapy:   Observation   Interim History:  Leroy Jones is here today for follow-up. He had issues recently with weight loss after starting Jardiance. His PCP with [facility] has since stopped the medication for this reason.  His blood glucose is still running high at 225.  He had an episode of vertigo a week ago while walking his dog and fell. Thankfully he was not seriously injured. He takes Antivert when needed.  His ostomy is working appropriately. CEA in January was stable at 1.97. Today's result is pending.  He states that he follows up with Dr. Ronnald Ramp next month and plans to see when he is due for colonoscopy. His last one was 3 years ago and he states it was negative.  No episodes of bleeding, no bruising or petechiae.  No fever, chills, n/v, cough, rash, SOB, chest pain, palpitations, abdominal pain or changes in bowel or bladder habits. He has urinary urgency. No pain or burning. No issues with emptying.  No swelling or tenderness in his extremities.  He has positional numbness and tingling at times in his hands and feet when driving.  He is eating well and staying hydrated. His weight is stable now at 175 lbs.  He walks his dog 3 times a day for exercise.   ECOG Performance Status: 1 - Symptomatic but completely ambulatory  Medications:  Allergies as of 04/26/2019      Reactions   Codeine Nausea And Vomiting   Oxycodone Hcl Nausea And Vomiting      Medication List       Accurate as of April 26, 2019  3:25 PM. If you have any questions, ask your nurse or doctor.        gabapentin 300 MG capsule Commonly known as: NEURONTIN Take 300 mg by mouth 3 (three) times daily.   GLUCOSAMINE PO Take 1 tablet by mouth daily.   insulin aspart 100 UNIT/ML injection Commonly known as: novoLOG Inject into  the skin 3 (three) times daily with meals. < 60 4 UNITS    > 61   6 UNITS       OVER 260  14 UNITS   insulin glargine 100 UNIT/ML injection Commonly known as: LANTUS Inject 22 Units into the skin at bedtime.   metFORMIN 1000 MG tablet Commonly known as: GLUCOPHAGE Take 1,000 mg by mouth 2 (two) times daily with a meal.   sertraline 100 MG tablet Commonly known as: ZOLOFT Take 100 mg by mouth daily.       Allergies:  Allergies  Allergen Reactions  . Codeine Nausea And Vomiting  . Oxycodone Hcl Nausea And Vomiting    Past Medical History, Surgical history, Social history, and Family History were reviewed and updated.  Review of Systems: All other 10 point review of systems is negative.   Physical Exam:  height is 5\' 8"  (1.727 m) and weight is 175 lb (79.4 kg). His temporal temperature is 98.6 F (37 C). His blood pressure is 152/99 (abnormal) and his pulse is 76. His respiration is 18 and oxygen saturation is 100%.   Wt Readings from Last 3 Encounters:  04/26/19 175 lb (79.4 kg)  09/05/18 189 lb (85.7 kg)  09/06/17 200 lb 6.4 oz (90.9 kg)    Ocular: Sclerae unicteric, pupils equal, round and reactive to light Ear-nose-throat: Oropharynx clear,  dentition fair Lymphatic: No cervical or supraclavicular adenopathy Lungs no rales or rhonchi, good excursion bilaterally Heart regular rate and rhythm, no murmur appreciated Abd soft, nontender, positive bowel sounds, no liver or spleen tip palpated on exam, no fluid wave  MSK no focal spinal tenderness, no joint edema Neuro: non-focal, well-oriented, appropriate affect Breasts: Deferred   Lab Results  Component Value Date   WBC 10.7 (H) 04/26/2019   HGB 12.7 (L) 04/26/2019   HCT 39.1 04/26/2019   MCV 90.9 04/26/2019   PLT 276 04/26/2019   No results found for: FERRITIN, IRON, TIBC, UIBC, IRONPCTSAT Lab Results  Component Value Date   RBC 4.30 04/26/2019   No results found for: KPAFRELGTCHN, LAMBDASER, KAPLAMBRATIO  No results found for: IGGSERUM, IGA, IGMSERUM No results found for: Odetta Pink, SPEI   Chemistry      Component Value Date/Time   NA 139 04/26/2019 1438   NA 143 09/07/2016 1427   K 5.0 04/26/2019 1438   K 4.3 09/07/2016 1427   CL 104 04/26/2019 1438   CL 104 09/07/2016 1427   CO2 26 04/26/2019 1438   CO2 29 09/07/2016 1427   BUN 19 04/26/2019 1438   BUN 14 09/07/2016 1427   CREATININE 1.06 04/26/2019 1438   CREATININE 0.8 09/07/2016 1427      Component Value Date/Time   CALCIUM 9.8 04/26/2019 1438   CALCIUM 9.4 09/07/2016 1427   ALKPHOS 56 04/26/2019 1438   ALKPHOS 56 09/07/2016 1427   AST 18 04/26/2019 1438   ALT 13 04/26/2019 1438   ALT 32 09/07/2016 1427   BILITOT 0.5 04/26/2019 1438       Impression and Plan: Mr. Jones is a very pleasant 73 yo caucasian gentleman with stage I rectal cancer diagnosed back in 2001. We will plan to see him back in another year. He will let us know if he has further issues with weight loss or other concerns and if he needs a letter of recommendation for colonoscopy.  We can certainly see him sooner if needed.   Laverna Peace, NP 9/16/20203:25 PM

## 2019-04-27 ENCOUNTER — Telehealth: Payer: Self-pay | Admitting: Hematology & Oncology

## 2019-04-27 LAB — LACTATE DEHYDROGENASE: LDH: 158 U/L (ref 98–192)

## 2019-04-27 LAB — CEA (IN HOUSE-CHCC): CEA (CHCC-In House): 1.74 ng/mL (ref 0.00–5.00)

## 2019-04-27 NOTE — Telephone Encounter (Signed)
Called and spoke with patient and gave him update info regarding follow up appointment for 2021 being moved out until September.  I also mailed a calendar to him as well.  Per 9/16 los

## 2019-05-31 DIAGNOSIS — H35033 Hypertensive retinopathy, bilateral: Secondary | ICD-10-CM | POA: Diagnosis not present

## 2019-05-31 DIAGNOSIS — H25013 Cortical age-related cataract, bilateral: Secondary | ICD-10-CM | POA: Diagnosis not present

## 2019-05-31 DIAGNOSIS — H2513 Age-related nuclear cataract, bilateral: Secondary | ICD-10-CM | POA: Diagnosis not present

## 2019-05-31 DIAGNOSIS — H35363 Drusen (degenerative) of macula, bilateral: Secondary | ICD-10-CM | POA: Diagnosis not present

## 2019-06-21 DIAGNOSIS — H35033 Hypertensive retinopathy, bilateral: Secondary | ICD-10-CM | POA: Diagnosis not present

## 2019-06-21 DIAGNOSIS — H2513 Age-related nuclear cataract, bilateral: Secondary | ICD-10-CM | POA: Diagnosis not present

## 2019-06-21 DIAGNOSIS — H35363 Drusen (degenerative) of macula, bilateral: Secondary | ICD-10-CM | POA: Diagnosis not present

## 2019-06-21 DIAGNOSIS — H25013 Cortical age-related cataract, bilateral: Secondary | ICD-10-CM | POA: Diagnosis not present

## 2019-06-21 DIAGNOSIS — H2512 Age-related nuclear cataract, left eye: Secondary | ICD-10-CM | POA: Diagnosis not present

## 2019-07-04 DIAGNOSIS — H2512 Age-related nuclear cataract, left eye: Secondary | ICD-10-CM | POA: Diagnosis not present

## 2019-07-04 DIAGNOSIS — H25812 Combined forms of age-related cataract, left eye: Secondary | ICD-10-CM | POA: Diagnosis not present

## 2019-07-20 DIAGNOSIS — H25011 Cortical age-related cataract, right eye: Secondary | ICD-10-CM | POA: Diagnosis not present

## 2019-07-20 DIAGNOSIS — H2511 Age-related nuclear cataract, right eye: Secondary | ICD-10-CM | POA: Diagnosis not present

## 2019-09-07 ENCOUNTER — Other Ambulatory Visit: Payer: No Typology Code available for payment source

## 2019-09-07 ENCOUNTER — Ambulatory Visit: Payer: No Typology Code available for payment source | Admitting: Hematology & Oncology

## 2019-10-14 IMAGING — NM NM BONE 3 PHASE
10 series · 20 of 20 positions shown · non-contrast
Comparison: None.

CLINICAL DATA: Right total knee arthroplasty approximately 5 years
ago. Right knee pain.

EXAM:
NUCLEAR MEDICINE 3-PHASE BONE SCAN
TECHNIQUE: Radionuclide angiographic images, immediate static blood pool
images, and 3-hour delayed static images were obtained of the
bilateral knees after intravenous injection of radiopharmaceutical.
RADIOPHARMACEUTICALS:  20.6 mCi Qc-PPm MDP IV

[Series 1: flow · 2.07mm/px · 6 of 48 frames shown (1 of 2)]
[frame 5/48]
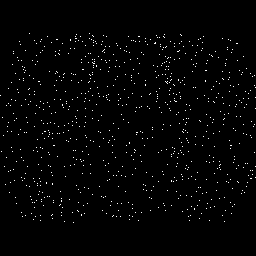
[frame 13/48]
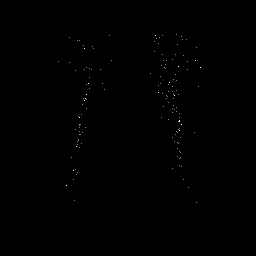
[frame 21/48]
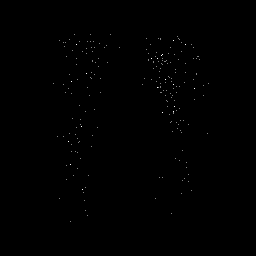
[frame 29/48]
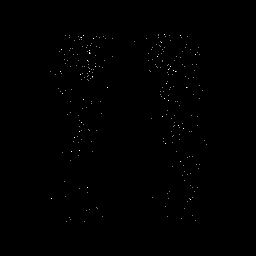
[frame 37/48  full-range]
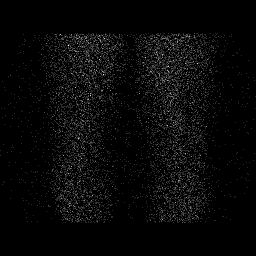
[frame 45/48  full-range]
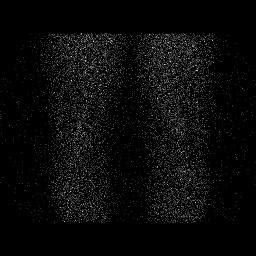

[Series 1: flow · 2.07mm/px · 6 of 48 frames shown (2 of 2)]
[frame 5/48]
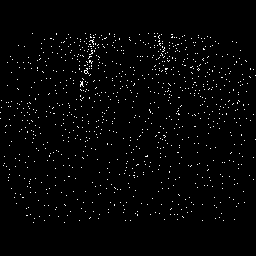
[frame 13/48]
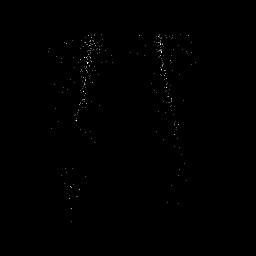
[frame 21/48]
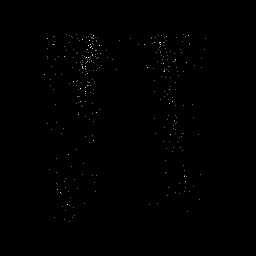
[frame 29/48  full-range]
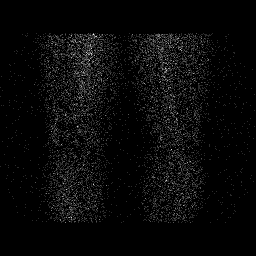
[frame 37/48  full-range]
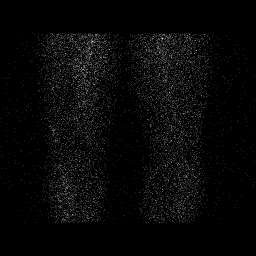
[frame 45/48  full-range]
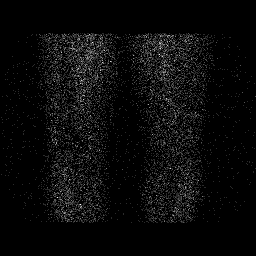

[Series 2: blood pool · 2.07mm/px · 1 of 1 slices shown (1 of 2)]
[im 1/1  full-range]
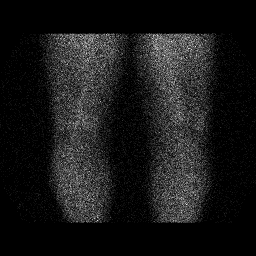

[Series 2: blood pool · 2.07mm/px · 1 of 1 slices shown (2 of 2)]
[im 1/1  full-range]
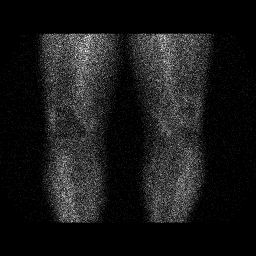

[Series 3: lat bp · 2.07mm/px · 1 of 1 slices shown (1 of 2)]
[im 1/1  full-range]
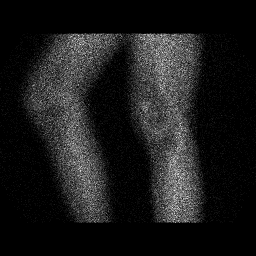

[Series 3: lat bp · 2.07mm/px · 1 of 1 slices shown (2 of 2)]
[im 1/1  full-range]
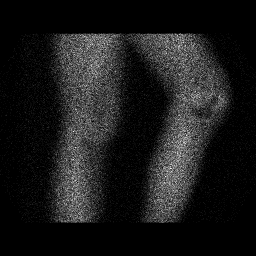

[Series 4: delay · delayed · 2.07mm/px · 1 of 1 slices shown (1 of 4)]
[im 1/1]
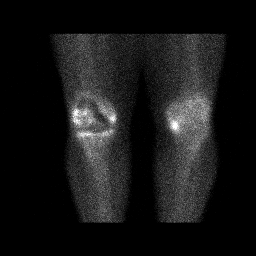

[Series 4: delay · delayed · 2.07mm/px · 1 of 1 slices shown (2 of 4)]
[im 1/1  full-range]
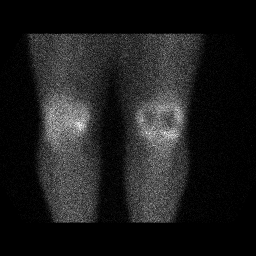

[Series 5: delay · delayed · 2.07mm/px · 1 of 1 slices shown (3 of 4)]
[im 1/1]
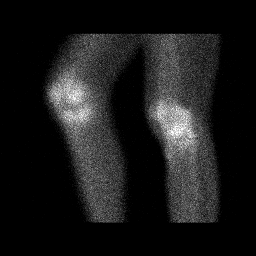

[Series 5: delay · delayed · 2.07mm/px · 1 of 1 slices shown (4 of 4)]
[im 1/1]
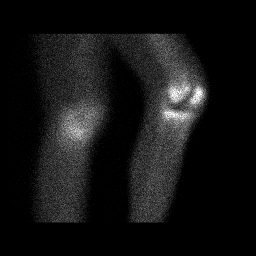

[20 of 20 positions shown; findings below may reference images not displayed]

FINDINGS: Vascular phase: Symmetric flow to both lower extremities.

Blood pool phase: No abnormalities are identified.

Delayed phase: Diffuse and fairly symmetric uptake around the right
knee prosthesis. This is likely chronic remodeling. No findings
suspicious for loosening or fracture.

Moderate degenerative changes around the left knee consistent with
degenerative disease with a more focal area in the medial tibial
plateau.
IMPRESSION: 1. Normal vascular and blood pool phases.
2. Diffuse and fairly symmetric uptake around the right knee
prosthesis, likely chronic remodeling. No findings suspicious for
loosening or fracture.
3. Moderate degenerative type uptake around the left knee.

## 2020-02-09 ENCOUNTER — Telehealth: Payer: Self-pay

## 2020-02-09 NOTE — Telephone Encounter (Signed)
NOTES ON FILE FROM [facility] [place] [phone] SENT REFERRAL TO SCHEDULING 

## 2020-02-28 DIAGNOSIS — H35363 Drusen (degenerative) of macula, bilateral: Secondary | ICD-10-CM | POA: Diagnosis not present

## 2020-02-28 DIAGNOSIS — Z961 Presence of intraocular lens: Secondary | ICD-10-CM | POA: Diagnosis not present

## 2020-02-28 DIAGNOSIS — H43813 Vitreous degeneration, bilateral: Secondary | ICD-10-CM | POA: Diagnosis not present

## 2020-02-28 DIAGNOSIS — H35033 Hypertensive retinopathy, bilateral: Secondary | ICD-10-CM | POA: Diagnosis not present

## 2020-03-05 NOTE — Progress Notes (Signed)
Cardiology Office Note:   Date:  03/07/2020  NAME:  Leroy Jones    MRN: [id number] DOB:  July 23, 1946   PCP:  [facility]  Cardiologist:  No primary care provider on file.   Referring MD: [facility]   Chief Complaint  Patient presents with  . Dizziness   History of Present Illness:   Leroy Jones is a 74 y.o. male with a hx of DM, HLD, colon cancer status post colectomy who is being seen today for the evaluation of dizziness at the request of [facility]. Evaluated by Neurology at [facility] 02/02/2020 for dizziness upon standing.  He reports he had dizziness upon standing for years.  His chronic medical problems include diabetes and hyperlipidemia.  He reports no history of hypertension.  He reports when he stands up or does things quickly he can get dizzy.  He said no syncope.  He reports symptoms resolve with standing slow as well as adequate hydration.  He also reports that he is recently started B12 supplementation and this was noted to improve his symptoms.  He is never had a heart attack or stroke.  He reports that he walks 3 blocks per day and describes no chest pain or shortness of breath.  He was seen by neurology at the [facility] and had a negative work-up.  I do not have the results of the vascular ultrasounds of his neck but he reports they were normal.  He also had an echocardiogram that apparently was normal per his report.  He reports he saw a cardiologist at the [facility] and they recommended he be evaluated here for a tilt table test.  We are unable to do a tilt table test as we no longer do this in our facility.  He describes no recent medication changes.  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  Recent thyroid studies were normal with a TSH of 1.21.  He describes no recent medication changes.  He is a former smoker but quit a number of years ago.  He only smoked for about 14 years.  He has never had a heart attack or stroke.  His father had heart  attack.  No illicit drug use reported.  No alcohol consumption either.  Overall he reports his symptoms are improving.  He was simply sent to [place] for evaluation for possible tilt table testing.  Problem List 1. DM -A1c 7.7 2. HLD -T cho 116, HDL 46, TG 78, LDL 54 3.  Colon cancer status post colectomy  Past Medical History: Past Medical History:  Diagnosis Date  . Diabetes mellitus without complication ([place])   . Rectal cancer ([facility]) 09/17/2011    Past Surgical History: Past Surgical History:  Procedure Laterality Date  . colon cancer    . COLOSTOMY    . KNEE SURGERY      Current Medications: Current Meds  Medication Sig  . acetaminophen (TYLENOL) 325 MG tablet Take 650 mg by mouth every 6 (six) hours as needed for mild pain or fever.  Marland Kitchen ascorbic acid (VITAMIN C) 500 MG tablet Take 500 mg by mouth daily.  Marland Kitchen atorvastatin (LIPITOR) 40 MG tablet Take 40 mg by mouth at bedtime.  . cyanocobalamin (,VITAMIN B-12,) 1000 MCG/ML injection Inject 1,000 mcg into the muscle every 30 (thirty) days.  . empagliflozin (JARDIANCE) 25 MG TABS tablet Take 12.5 mg by mouth daily.  . ferrous sulfate 325 (65 FE) MG tablet Take 325 mg by mouth 3 (three) times  a week.  Marland Kitchen FLUoxetine (PROZAC) 10 MG capsule Take 10 mg by mouth daily.  Marland Kitchen gabapentin (NEURONTIN) 300 MG capsule Take 300 mg by mouth 3 (three) times daily.  Marland Kitchen glucose 4 GM chewable tablet Chew 4 tablets by mouth as needed for low blood sugar (Repeat every 15 minutes if blood sugar less than 70).  . insulin aspart (NOVOLOG FLEXPEN) 100 UNIT/ML FlexPen Inject into the skin 3 (three) times daily with meals. Sliding scale  . insulin glargine (LANTUS) 100 UNIT/ML injection Inject 12 Units into the skin at bedtime.   . Magnesium Oxide 420 (252 Mg) MG TABS Take 1 tablet by mouth daily.  . meloxicam (MOBIC) 15 MG tablet Take 15 mg by mouth daily.  . metFORMIN (GLUCOPHAGE) 500 MG tablet Take 1,000 mg by mouth 2 (two) times daily with a meal.  . sertraline  (ZOLOFT) 100 MG tablet Take 100 mg by mouth daily.  . vitamin B-12 (CYANOCOBALAMIN) 500 MCG tablet Take 500 mcg by mouth daily.     Allergies:    Codeine, Oxycodone hcl, Dramamine [dimenhydrinate], Lisinopril, Lovastatin, and Simvastatin   Social History: Social History   Socioeconomic History  . Marital status: Married    Spouse name: Not on file  . Number of children: 2  . Years of education: Not on file  . Highest education level: Not on file  Occupational History  . Occupation: retired  Tobacco Use  . Smoking status: Former Smoker    Years: 15.00    Quit date: 03/07/1980    Years since quitting: 40.0  . Smokeless tobacco: Never Used  . Tobacco comment: never used tobacco  Vaping Use  . Vaping Use: Never used  Substance and Sexual Activity  . Alcohol use: No    Alcohol/week: 0.0 standard drinks  . Drug use: No  . Sexual activity: Not on file  Other Topics Concern  . Not on file  Social History Narrative  . Not on file   Social Determinants of Health   Financial Resource Strain:   . Difficulty of Paying Living Expenses:   Food Insecurity:   . Worried About Charity fundraiser in the Last Year:   . Arboriculturist in the Last Year:   Transportation Needs:   . Film/video editor (Medical):   Marland Kitchen Lack of Transportation (Non-Medical):   Physical Activity:   . Days of Exercise per Week:   . Minutes of Exercise per Session:   Stress:   . Feeling of Stress :   Social Connections:   . Frequency of Communication with Friends and Family:   . Frequency of Social Gatherings with Friends and Family:   . Attends Religious Services:   . Active Member of Clubs or Organizations:   . Attends Archivist Meetings:   Marland Kitchen Marital Status:      Family History: The patient's family history includes Heart attack in his father.  ROS:   All other ROS reviewed and negative. Pertinent positives noted in the HPI.     EKGs/Labs/Other Studies Reviewed:   The following  studies were personally reviewed by me today:  EKG:  EKG is ordered today.  The ekg ordered today demonstrates normal sinus rhythm, heart rate 73, old anterior infarct, no acute ST-T changes, and was personally reviewed by me.   Recent Labs: 04/26/2019: ALT 13; BUN 19; Creatinine 1.06; Hemoglobin 12.7; Platelet Count 276; Potassium 5.0; Sodium 139   Recent Lipid Panel No results found for: CHOL, TRIG,  HDL, CHOLHDL, VLDL, LDLCALC, LDLDIRECT  Physical Exam:   VS:  BP (!) 130/74   Pulse 73   Ht 5\' 8"  (1.727 m)   Wt 172 lb (78 kg)   BMI 26.15 kg/m    Wt Readings from Last 3 Encounters:  03/07/20 172 lb (78 kg)  04/26/19 175 lb (79.4 kg)  09/05/18 189 lb (85.7 kg)    General: Well nourished, well developed, in no acute distress Heart: Atraumatic, normal size  Eyes: PEERLA, EOMI  Neck: Supple, no JVD Endocrine: No thryomegaly Cardiac: Normal S1, S2; RRR; no murmurs, rubs, or gallops Lungs: Clear to auscultation bilaterally, no wheezing, rhonchi or rales  Abd: Soft, nontender, no hepatomegaly  Ext: No edema, pulses 2+ Musculoskeletal: No deformities, BUE and BLE strength normal and equal Skin: Warm and dry, no rashes   Neuro: Alert and oriented to person, place, time, and situation, CNII-XII grossly intact, no focal deficits  Psych: Normal mood and affect   ASSESSMENT:   KRISTAPHER DUBUQUE is a 74 y.o. male who presents for the following: 1. Dizziness   2. Orthostatic hypotension     PLAN:   1. Dizziness 2. Orthostatic hypotension -He describes symptoms of dizziness upon standing for years.  He reports no red flag symptoms such as chest pain or syncope.  His EKG shows an old anterior infarct but he reports he had an echocardiogram and cardiology evaluation at the [place] and he reports everything was normal.  I do not have the results of that echocardiogram but I will have to trust him.  He is also had carotid ultrasound which he reports are normal.  Again I do not have those records.   Recent thyroid studies are normal.  He was sent to [place] for tilt table testing.  Unfortunately, we do not offer that test in our facility anymore.  However, his symptoms are clearly related to orthostasis.  I recommended adequate hydration, compression stockings and conservative measures such as standing slowly and to avoid prolonged standing episodes that could result in orthostasis.  He also reports that his symptoms are improving with B12 supplementation.  I see no red flag symptoms to warrant stress testing or other cardiac testing.  Again, he has a cardiologist at the [place].  We will send our note so they know that we no longer offered tilt table testing and maybe he can look to have this done through the [facility] system.  Disposition: Return if symptoms worsen or fail to improve.  Medication Adjustments/Labs and Tests Ordered: Current medicines are reviewed at length with the patient today.  Concerns regarding medicines are outlined above.  Orders Placed This Encounter  Procedures  . EKG 12-Lead   No orders of the defined types were placed in this encounter.   Patient Instructions  Medication Instructions:  The current medical regimen is effective;  continue present plan and medications.  *If you need a refill on your cardiac medications before your next appointment, please call your pharmacy*   Follow-Up: At [facility], you and your health needs are our priority.  As part of our continuing mission to provide you with exceptional heart care, we have created designated Provider Care Teams.  These Care Teams include your primary Cardiologist (physician) and Advanced Practice Providers (APPs -  Physician Assistants and Nurse Practitioners) who all work together to provide you with the care you need, when you need it.  We recommend signing up for the patient portal called "MyChart".  Sign up information  is provided on this After Visit Summary.  MyChart is used to connect with patients for  Virtual Visits (Telemedicine).  Patients are able to view lab/test results, encounter notes, upcoming appointments, etc.  Non-urgent messages can be sent to your provider as well.   To learn more about what you can do with MyChart, go to [email or web address].    Your next appointment:   As needed  The format for your next appointment:   In Person  Provider:   Eleonore Chiquito, MD   How to Use Compression Stockings Compression stockings are elastic socks that squeeze the legs. They help increase blood flow (circulation) to the legs, decrease swelling in the legs, and reduce the chance of developing blood clots in the lower legs. Compression stockings are often used by people who:  Are recovering from surgery.  Have poor circulation in their legs.  Tend to get blood clots in their legs.  Have bulging (varicose) veins.  Sit or stay in bed for long periods of time. Follow instructions from your health care provider about how and when to wear your compression stockings. How to wear compression stockings Before you put on your compression stockings:  Make sure that they are the correct size and degree of compression. If you do not know your size or required grade of compression, ask your health care provider and follow the manufacturer's instructions that come with the stockings.  Make sure that they are clean, dry, and in good condition.  Check them for rips and tears. Do not put them on if they are ripped or torn. Put your stockings on first thing in the morning, before you get out of bed. Keep them on for as long as your health care provider advises. When you are wearing your stockings:  Keep them as smooth as possible. Do not allow them to bunch up. It is especially important to prevent the stockings from bunching up around your toes or behind your knees.  Do not roll the stockings downward and leave them rolled down. This can decrease blood flow to your leg.  Change them right  away if they become wet or dirty. When you take off your stockings, inspect your legs and feet. Check for:  Open sores.  Red spots.  Swelling. General tips  Do not stop wearing compression stockings without talking to your health care provider first.  Wash your stockings every day with mild detergent in cold or warm water. Do not use bleach. Air-dry your stockings or dry them in a clothes dryer on low heat. It may be helpful to have two pairs so that you have a pair to wear while the other is being washed.  Replace your stockings every 3-6 months.  If skin moisturizing is part of your treatment plan, apply lotion or cream at night so that your skin will be dry when you put on the stockings in the morning. It is harder to put the stockings on when you have lotion on your legs or feet.  Wear nonskid shoes or slip-resistant socks when walking while wearing compression stockings. Contact a health care provider and remove your stockings if you have:  A feeling of pins and needles in your feet or legs.  Open sores, red spots, or other skin changes on your feet or legs.  Swelling or pain that gets worse. Get help right away if you have:  Numbness or tingling in your lower legs that does not get better right after you take the stockings  off.  Toes or feet that are unusually cold or turn a bluish color.  A warm or red area on your leg.  New swelling or soreness in your leg.  Shortness of breath.  Chest pain.  A fast or irregular heartbeat.  Light-headedness.  Dizziness. Summary  Compression stockings are elastic socks that squeeze the legs.  They help increase blood flow (circulation) to the legs, decrease swelling in the legs, and reduce the chance of developing blood clots in the lower legs.  Follow instructions from your health care provider about how and when to wear your compression stockings.  Do not stop wearing your compression stockings without talking to your  health care provider first. This information is not intended to replace advice given to you by your health care provider. Make sure you discuss any questions you have with your health care provider. Document Revised: 07/29/2017 Document Reviewed: 07/29/2017 Elsevier Patient Education  2020 [place], [place]. Audie Box, [place]  [address], [place], [place] [address] [phone]  03/07/2020 12:27 PM

## 2020-03-06 ENCOUNTER — Other Ambulatory Visit: Payer: Self-pay

## 2020-03-06 ENCOUNTER — Encounter: Payer: Self-pay | Admitting: Cardiovascular Disease

## 2020-03-07 ENCOUNTER — Ambulatory Visit (INDEPENDENT_AMBULATORY_CARE_PROVIDER_SITE_OTHER): Payer: No Typology Code available for payment source | Admitting: Cardiovascular Disease

## 2020-03-07 ENCOUNTER — Encounter: Payer: Self-pay | Admitting: Cardiovascular Disease

## 2020-03-07 ENCOUNTER — Other Ambulatory Visit: Payer: Self-pay

## 2020-03-07 VITALS — BP 130/74 | HR 73 | Ht 68.0 in | Wt 172.0 lb

## 2020-03-07 DIAGNOSIS — R42 Dizziness and giddiness: Secondary | ICD-10-CM

## 2020-03-07 DIAGNOSIS — I951 Orthostatic hypotension: Secondary | ICD-10-CM

## 2020-03-07 NOTE — Patient Instructions (Signed)
Medication Instructions:  The current medical regimen is effective;  continue present plan and medications.  *If you need a refill on your cardiac medications before your next appointment, please call your pharmacy*   Follow-Up: At [facility], you and your health needs are our priority.  As part of our continuing mission to provide you with exceptional heart care, we have created designated Provider Care Teams.  These Care Teams include your primary Cardiologist (physician) and Advanced Practice Providers (APPs -  Physician Assistants and Nurse Practitioners) who all work together to provide you with the care you need, when you need it.  We recommend signing up for the patient portal called "MyChart".  Sign up information is provided on this After Visit Summary.  MyChart is used to connect with patients for Virtual Visits (Telemedicine).  Patients are able to view lab/test results, encounter notes, upcoming appointments, etc.  Non-urgent messages can be sent to your provider as well.   To learn more about what you can do with MyChart, go to [email or web address].    Your next appointment:   As needed  The format for your next appointment:   In Person  Provider:   Eleonore Chiquito, MD   How to Use Compression Stockings Compression stockings are elastic socks that squeeze the legs. They help increase blood flow (circulation) to the legs, decrease swelling in the legs, and reduce the chance of developing blood clots in the lower legs. Compression stockings are often used by people who:  Are recovering from surgery.  Have poor circulation in their legs.  Tend to get blood clots in their legs.  Have bulging (varicose) veins.  Sit or stay in bed for long periods of time. Follow instructions from your health care provider about how and when to wear your compression stockings. How to wear compression stockings Before you put on your compression stockings:  Make sure that they are the  correct size and degree of compression. If you do not know your size or required grade of compression, ask your health care provider and follow the manufacturer's instructions that come with the stockings.  Make sure that they are clean, dry, and in good condition.  Check them for rips and tears. Do not put them on if they are ripped or torn. Put your stockings on first thing in the morning, before you get out of bed. Keep them on for as long as your health care provider advises. When you are wearing your stockings:  Keep them as smooth as possible. Do not allow them to bunch up. It is especially important to prevent the stockings from bunching up around your toes or behind your knees.  Do not roll the stockings downward and leave them rolled down. This can decrease blood flow to your leg.  Change them right away if they become wet or dirty. When you take off your stockings, inspect your legs and feet. Check for:  Open sores.  Red spots.  Swelling. General tips  Do not stop wearing compression stockings without talking to your health care provider first.  Wash your stockings every day with mild detergent in cold or warm water. Do not use bleach. Air-dry your stockings or dry them in a clothes dryer on low heat. It may be helpful to have two pairs so that you have a pair to wear while the other is being washed.  Replace your stockings every 3-6 months.  If skin moisturizing is part of your treatment plan, apply lotion or cream  at night so that your skin will be dry when you put on the stockings in the morning. It is harder to put the stockings on when you have lotion on your legs or feet.  Wear nonskid shoes or slip-resistant socks when walking while wearing compression stockings. Contact a health care provider and remove your stockings if you have:  A feeling of pins and needles in your feet or legs.  Open sores, red spots, or other skin changes on your feet or legs.  Swelling or  pain that gets worse. Get help right away if you have:  Numbness or tingling in your lower legs that does not get better right after you take the stockings off.  Toes or feet that are unusually cold or turn a bluish color.  A warm or red area on your leg.  New swelling or soreness in your leg.  Shortness of breath.  Chest pain.  A fast or irregular heartbeat.  Light-headedness.  Dizziness. Summary  Compression stockings are elastic socks that squeeze the legs.  They help increase blood flow (circulation) to the legs, decrease swelling in the legs, and reduce the chance of developing blood clots in the lower legs.  Follow instructions from your health care provider about how and when to wear your compression stockings.  Do not stop wearing your compression stockings without talking to your health care provider first. This information is not intended to replace advice given to you by your health care provider. Make sure you discuss any questions you have with your health care provider. Document Revised: 07/29/2017 Document Reviewed: 07/29/2017 Elsevier Patient Education  2020 [employer].

## 2020-04-25 ENCOUNTER — Inpatient Hospital Stay (HOSPITAL_BASED_OUTPATIENT_CLINIC_OR_DEPARTMENT_OTHER): Payer: Medicare Other | Admitting: Hematology & Oncology

## 2020-04-25 ENCOUNTER — Other Ambulatory Visit: Payer: Self-pay

## 2020-04-25 ENCOUNTER — Encounter: Payer: Self-pay | Admitting: Hematology & Oncology

## 2020-04-25 ENCOUNTER — Inpatient Hospital Stay: Payer: Medicare Other | Attending: Hematology & Oncology

## 2020-04-25 VITALS — BP 99/57 | HR 70 | Temp 97.9°F | Resp 18 | Ht 68.0 in | Wt 170.0 lb

## 2020-04-25 DIAGNOSIS — Z79899 Other long term (current) drug therapy: Secondary | ICD-10-CM | POA: Diagnosis not present

## 2020-04-25 DIAGNOSIS — C20 Malignant neoplasm of rectum: Secondary | ICD-10-CM | POA: Diagnosis not present

## 2020-04-25 DIAGNOSIS — Z794 Long term (current) use of insulin: Secondary | ICD-10-CM | POA: Insufficient documentation

## 2020-04-25 DIAGNOSIS — E119 Type 2 diabetes mellitus without complications: Secondary | ICD-10-CM | POA: Diagnosis not present

## 2020-04-25 DIAGNOSIS — Z933 Colostomy status: Secondary | ICD-10-CM | POA: Insufficient documentation

## 2020-04-25 DIAGNOSIS — Z85048 Personal history of other malignant neoplasm of rectum, rectosigmoid junction, and anus: Secondary | ICD-10-CM | POA: Insufficient documentation

## 2020-04-25 LAB — CBC WITH DIFFERENTIAL (CANCER CENTER ONLY)
Abs Immature Granulocytes: 0.03 10*3/uL (ref 0.00–0.07)
Basophils Absolute: 0.1 10*3/uL (ref 0.0–0.1)
Basophils Relative: 1 %
Eosinophils Absolute: 0.3 10*3/uL (ref 0.0–0.5)
Eosinophils Relative: 5 %
HCT: 44.3 % (ref 39.0–52.0)
Hemoglobin: 14.5 g/dL (ref 13.0–17.0)
Immature Granulocytes: 0 %
Lymphocytes Relative: 26 %
Lymphs Abs: 1.8 10*3/uL (ref 0.7–4.0)
MCH: 30.1 pg (ref 26.0–34.0)
MCHC: 32.7 g/dL (ref 30.0–36.0)
MCV: 92.1 fL (ref 80.0–100.0)
Monocytes Absolute: 0.5 10*3/uL (ref 0.1–1.0)
Monocytes Relative: 8 %
Neutro Abs: 4.1 10*3/uL (ref 1.7–7.7)
Neutrophils Relative %: 60 %
Platelet Count: 247 10*3/uL (ref 150–400)
RBC: 4.81 MIL/uL (ref 4.22–5.81)
RDW: 13.1 % (ref 11.5–15.5)
WBC Count: 6.8 10*3/uL (ref 4.0–10.5)
nRBC: 0 % (ref 0.0–0.2)

## 2020-04-25 LAB — CMP (CANCER CENTER ONLY)
ALT: 11 U/L (ref 0–44)
AST: 14 U/L — ABNORMAL LOW (ref 15–41)
Albumin: 4 g/dL (ref 3.5–5.0)
Alkaline Phosphatase: 47 U/L (ref 38–126)
Anion gap: 7 (ref 5–15)
BUN: 14 mg/dL (ref 8–23)
CO2: 31 mmol/L (ref 22–32)
Calcium: 10.2 mg/dL (ref 8.9–10.3)
Chloride: 103 mmol/L (ref 98–111)
Creatinine: 0.88 mg/dL (ref 0.61–1.24)
GFR, Est AFR Am: >60 mL/min (ref >60)
GFR, Estimated: >60 mL/min (ref >60)
Glucose, Bld: 252 mg/dL — ABNORMAL HIGH (ref 70–99)
Potassium: 5 mmol/L (ref 3.5–5.1)
Sodium: 141 mmol/L (ref 135–145)
Total Bilirubin: 0.6 mg/dL (ref 0.3–1.2)
Total Protein: 6.2 g/dL — ABNORMAL LOW (ref 6.5–8.1)

## 2020-04-25 NOTE — Progress Notes (Signed)
Hematology and Oncology Follow Up Visit  Leroy Jones [id number] [birth year] 74 y.o. 04/25/2020   Principle Diagnosis:  Stage I (T2N0M0) adenocarcinoma of the rectum  Current Therapy:   Observation   Interim History:  Leroy Jones is here today for his annual follow-up.  He is doing quite well.  He is very happy about his new diabetes detector.  This is much more patient friendly for him.  There is no wires.  It just works like a Nutritional therapist.  It is now been 20 years since we first saw him.  His last CEA from year ago was 1.74.  He did have cataract surgery a year ago.  Both eyes were operated on.  His vision is much better now.  He is still having problems with his right knee.  He is not going to have any surgery for this.    His appetite is doing well.  He has had no nausea or vomiting.  There is been no change in bowel or bladder habits.  He has a colostomy that works well.  Overall, his performance status is ECOG 0  Medications:  Allergies as of 04/25/2020      Reactions   Codeine Nausea And Vomiting   Oxycodone Hcl Nausea And Vomiting      Medication List       Accurate as of April 25, 2020 12:14 PM. If you have any questions, ask your nurse or doctor.        acetaminophen 325 MG tablet Commonly known as: TYLENOL Take 650 mg by mouth every 6 (six) hours as needed for mild pain or fever.   ascorbic acid 500 MG tablet Commonly known as: VITAMIN C Take 500 mg by mouth daily.   atorvastatin 40 MG tablet Commonly known as: LIPITOR Take 40 mg by mouth at bedtime.   cyanocobalamin 1000 MCG/ML injection Commonly known as: (VITAMIN B-12) Inject 1,000 mcg into the muscle every 30 (thirty) days.   vitamin B-12 500 MCG tablet Commonly known as: CYANOCOBALAMIN Take 500 mcg by mouth daily.   empagliflozin 25 MG Tabs tablet Commonly known as: JARDIANCE Take 12.5 mg by mouth daily.   ferrous sulfate 325 (65 FE) MG tablet Take 325 mg by mouth 3 (three) times a week.     FLUoxetine 10 MG capsule Commonly known as: PROZAC Take 10 mg by mouth daily.   gabapentin 300 MG capsule Commonly known as: NEURONTIN Take 300 mg by mouth 3 (three) times daily.   glucose 4 GM chewable tablet Chew 4 tablets by mouth as needed for low blood sugar (Repeat every 15 minutes if blood sugar less than 70).   insulin glargine 100 UNIT/ML injection Commonly known as: LANTUS Inject 12 Units into the skin at bedtime.   Magnesium Oxide 420 (252 Mg) MG Tabs Take 1 tablet by mouth daily.   meclizine 25 MG tablet Commonly known as: ANTIVERT Take 25 mg by mouth 3 (three) times daily as needed for dizziness.   meloxicam 15 MG tablet Commonly known as: MOBIC Take 15 mg by mouth daily.   metFORMIN 500 MG tablet Commonly known as: GLUCOPHAGE Take 1,000 mg by mouth 2 (two) times daily with a meal.   NovoLOG FlexPen 100 UNIT/ML FlexPen Generic drug: insulin aspart Inject into the skin 3 (three) times daily with meals. Sliding scale   sertraline 100 MG tablet Commonly known as: ZOLOFT Take 100 mg by mouth daily.       Allergies:  Allergies  Allergen Reactions  .  Codeine Nausea And Vomiting  . Oxycodone Hcl Nausea And Vomiting    Past Medical History, Surgical history, Social history, and Family History were reviewed and updated.  Review of Systems: Review of Systems  Constitutional: Negative.   HENT: Negative.   Eyes: Negative.   Respiratory: Negative.   Cardiovascular: Negative.   Gastrointestinal: Negative.   Genitourinary: Negative.   Musculoskeletal: Negative.   Skin: Negative.   Neurological: Negative.   Endo/Heme/Allergies: Negative.   Psychiatric/Behavioral: Negative.    Marland Kitchen   Physical Exam:  height is 5\' 8"  (1.727 m) and weight is 170 lb (77.1 kg). His oral temperature is 97.9 F (36.6 C). His blood pressure is 99/57 (abnormal) and his pulse is 70. His respiration is 18 and oxygen saturation is 99%.   Wt Readings from Last 3 Encounters:   04/25/20 170 lb (77.1 kg)  03/07/20 172 lb (78 kg)  04/26/19 175 lb (79.4 kg)    Physical Exam Vitals reviewed.  HENT:     Head: Normocephalic and atraumatic.  Eyes:     Pupils: Pupils are equal, round, and reactive to light.  Cardiovascular:     Rate and Rhythm: Normal rate and regular rhythm.     Heart sounds: Normal heart sounds.  Pulmonary:     Effort: Pulmonary effort is normal.     Breath sounds: Normal breath sounds.  Abdominal:     General: Bowel sounds are normal.     Palpations: Abdomen is soft.  Musculoskeletal:        General: No tenderness or deformity. Normal range of motion.     Cervical back: Normal range of motion.  Lymphadenopathy:     Cervical: No cervical adenopathy.  Skin:    General: Skin is warm and dry.     Findings: No erythema or rash.  Neurological:     Mental Status: He is alert and oriented to person, place, and time.  Psychiatric:        Behavior: Behavior normal.        Thought Content: Thought content normal.        Judgment: Judgment normal.      Lab Results  Component Value Date   WBC 6.8 04/25/2020   HGB 14.5 04/25/2020   HCT 44.3 04/25/2020   MCV 92.1 04/25/2020   PLT 247 04/25/2020   No results found for: FERRITIN, IRON, TIBC, UIBC, IRONPCTSAT Lab Results  Component Value Date   RBC 4.81 04/25/2020   No results found for: KPAFRELGTCHN, LAMBDASER, KAPLAMBRATIO No results found for: IGGSERUM, IGA, IGMSERUM No results found for: Odetta Pink, SPEI   Chemistry      Component Value Date/Time   NA 141 04/25/2020 1101   NA 143 09/07/2016 1427   K 5.0 04/25/2020 1101   K 4.3 09/07/2016 1427   CL 103 04/25/2020 1101   CL 104 09/07/2016 1427   CO2 31 04/25/2020 1101   CO2 29 09/07/2016 1427   BUN 14 04/25/2020 1101   BUN 14 09/07/2016 1427   CREATININE 0.88 04/25/2020 1101   CREATININE 0.8 09/07/2016 1427      Component Value Date/Time   CALCIUM 10.2 04/25/2020  1101   CALCIUM 9.4 09/07/2016 1427   ALKPHOS 47 04/25/2020 1101   ALKPHOS 56 09/07/2016 1427   AST 14 (L) 04/25/2020 1101   ALT 11 04/25/2020 1101   ALT 32 09/07/2016 1427   BILITOT 0.6 04/25/2020 1101      Impression and Plan: Mr.  Jones is a very pleasant 74 yo caucasian gentleman with stage I rectal cancer diagnosed back in 2001.   He has done well and has no complaints at this time.   We will continue to follow along with him annually.   He will contact our office with any questions or concerns. We can certainly see him sooner if need be.   Volanda Napoleon, MD 9/16/202112:14 PM

## 2020-04-26 LAB — LACTATE DEHYDROGENASE: LDH: 118 U/L (ref 98–192)

## 2020-04-26 LAB — CEA (IN HOUSE-CHCC): CEA (CHCC-In House): 1.17 ng/mL (ref 0.00–5.00)

## 2020-05-20 DIAGNOSIS — Z23 Encounter for immunization: Secondary | ICD-10-CM | POA: Diagnosis not present

## 2020-06-02 DIAGNOSIS — Z23 Encounter for immunization: Secondary | ICD-10-CM | POA: Diagnosis not present

## 2020-11-11 NOTE — Progress Notes (Signed)
DUE TO COVID-19 ONLY ONE VISITOR IS ALLOWED TO COME WITH YOU AND STAY IN THE WAITING ROOM ONLY DURING PRE OP AND PROCEDURE DAY OF SURGERY. THE 1 VISITOR  MAY VISIT WITH YOU AFTER SURGERY IN YOUR PRIVATE ROOM DURING VISITING HOURS ONLY!  YOU NEED TO HAVE A COVID 19 TEST ON__4/18/2022 _____ @_______ , THIS TEST MUST BE DONE BEFORE SURGERY,  COVID TESTING SITE [address] [place] [address], IT IS ON THE RIGHT GOING OUT [address] APPROXIMATELY  2 MINUTES PAST [employer] ON THE RIGHT. ONCE YOUR COVID TEST IS COMPLETED,  PLEASE BEGIN THE QUARANTINE INSTRUCTIONS AS OUTLINED IN YOUR HANDOUT.                TED GOODNER  11/11/2020   Your procedure is scheduled on:  11/27/2020   Report to [facility] Main  Entrance   Report to admitting at     [place] AM     Call this number if you have problems the morning of surgery [phone]    REMEMBER: NO  SOLID FOOD CANDY OR GUM AFTER MIDNIGHT. CLEAR LIQUIDS UNTIL   0530am        . NOTHING BY MOUTH EXCEPT CLEAR LIQUIDS UNTIL      0530am   . PLEASE FINISH ENSURE DRINK PER SURGEON ORDER  WHICH NEEDS TO BE COMPLETED AT  0530am     .      CLEAR LIQUID DIET   Foods Allowed                                                                    Coffee and tea, regular and decaf                            Fruit ices (not with fruit pulp)                                      Iced Popsicles                                    Carbonated beverages, regular and diet                                    Cranberry, grape and apple juices Sports drinks like Gatorade Lightly seasoned clear broth or consume(fat free) Sugar, honey syrup ___________________________________________________________________      BRUSH YOUR TEETH MORNING OF SURGERY AND RINSE YOUR MOUTH OUT, NO CHEWING GUM CANDY OR MINTS.     Take these medicines the morning of surgery with A SIP OF WATER:  Zoloft, Magnesium, Gabapentin, prozac   Take 1/2 of Lantus  Insulin nite before surgery.   Do not take Jardiance day before surgery .    DO NOT TAKE ANY DIABETIC MEDICATIONS DAY OF YOUR SURGERY  You may not have any metal on your body including hair pins and              piercings  Do not wear jewelry, make-up, lotions, powders or perfumes, deodorant             Do not wear nail polish on your fingernails.  Do not shave  48 hours prior to surgery.              Men may shave face and neck.   Do not bring valuables to the hospital. [place].  Contacts, dentures or bridgework may not be worn into surgery.  Leave suitcase in the car. After surgery it may be brought to your room.     Patients discharged the day of surgery will not be allowed to drive home. IF YOU ARE HAVING SURGERY AND GOING HOME THE SAME DAY, YOU MUST HAVE AN ADULT TO DRIVE YOU HOME AND BE WITH YOU FOR 24 HOURS. YOU MAY GO HOME BY TAXI OR UBER OR ORTHERWISE, BUT AN ADULT MUST ACCOMPANY YOU HOME AND STAY WITH YOU FOR 24 HOURS.  Name and phone number of your driver:  Special Instructions: N/A              Please read over the following fact sheets you were given: _____________________________________________________________________  [facility] - Preparing for Surgery Before surgery, you can play an important role.  Because skin is not sterile, your skin needs to be as free of germs as possible.  You can reduce the number of germs on your skin by washing with CHG (chlorahexidine gluconate) soap before surgery.  CHG is an antiseptic cleaner which kills germs and bonds with the skin to continue killing germs even after washing. Please DO NOT use if you have an allergy to CHG or antibacterial soaps.  If your skin becomes reddened/irritated stop using the CHG and inform your nurse when you arrive at Short Stay. Do not shave (including legs and underarms) for at least 48 hours prior to the first CHG shower.  You  may shave your face/neck. Please follow these instructions carefully:  1.  Shower with CHG Soap the night before surgery and the  morning of Surgery.  2.  If you choose to wash your hair, wash your hair first as usual with your  normal  shampoo.  3.  After you shampoo, rinse your hair and body thoroughly to remove the  shampoo.                           4.  Use CHG as you would any other liquid soap.  You can apply chg directly  to the skin and wash                       Gently with a scrungie or clean washcloth.  5.  Apply the CHG Soap to your body ONLY FROM THE NECK DOWN.   Do not use on face/ open                           Wound or open sores. Avoid contact with eyes, ears mouth and genitals (private parts).  Wash face,  Genitals (private parts) with your normal soap.             6.  Wash thoroughly, paying special attention to the area where your surgery  will be performed.  7.  Thoroughly rinse your body with warm water from the neck down.  8.  DO NOT shower/wash with your normal soap after using and rinsing off  the CHG Soap.                9.  Pat yourself dry with a clean towel.            10.  Wear clean pajamas.            11.  Place clean sheets on your bed the night of your first shower and do not  sleep with pets. Day of Surgery : Do not apply any lotions/deodorants the morning of surgery.  Please wear clean clothes to the hospital/surgery center.  FAILURE TO FOLLOW THESE INSTRUCTIONS MAY RESULT IN THE CANCELLATION OF YOUR SURGERY PATIENT SIGNATURE_________________________________  NURSE SIGNATURE__________________________________  ________________________________________________________________________

## 2020-11-18 ENCOUNTER — Encounter (HOSPITAL_COMMUNITY): Payer: Self-pay

## 2020-11-18 ENCOUNTER — Other Ambulatory Visit: Payer: Self-pay

## 2020-11-18 ENCOUNTER — Encounter (HOSPITAL_COMMUNITY)
Admission: RE | Admit: 2020-11-18 | Discharge: 2020-11-18 | Disposition: A | Discharge status: Home / Self Care | Payer: No Typology Code available for payment source | Source: Ambulatory Visit | Attending: Orthopedic Surgery | Admitting: Orthopedic Surgery

## 2020-11-18 DIAGNOSIS — Z01812 Encounter for preprocedural laboratory examination: Secondary | ICD-10-CM | POA: Insufficient documentation

## 2020-11-18 HISTORY — DX: Other specified postprocedural states: Z98.890

## 2020-11-18 HISTORY — DX: Unspecified osteoarthritis, unspecified site: M19.90

## 2020-11-18 HISTORY — DX: Polyneuropathy, unspecified: G62.9

## 2020-11-18 HISTORY — DX: Other specified postprocedural states: R11.2

## 2020-11-18 LAB — COMPREHENSIVE METABOLIC PANEL
ALT: 17 U/L (ref 0–44)
AST: 20 U/L (ref 15–41)
Albumin: 4.1 g/dL (ref 3.5–5.0)
Alkaline Phosphatase: 46 U/L (ref 38–126)
Anion gap: 6 (ref 5–15)
BUN: 19 mg/dL (ref 8–23)
CO2: 27 mmol/L (ref 22–32)
Calcium: 9.2 mg/dL (ref 8.9–10.3)
Chloride: 108 mmol/L (ref 98–111)
Creatinine, Ser: 0.75 mg/dL (ref 0.61–1.24)
GFR, Estimated: >60 mL/min (ref >60)
Glucose, Bld: 269 mg/dL — ABNORMAL HIGH (ref 70–99)
Potassium: 4.8 mmol/L (ref 3.5–5.1)
Sodium: 141 mmol/L (ref 135–145)
Total Bilirubin: 0.8 mg/dL (ref 0.3–1.2)
Total Protein: 7.1 g/dL (ref 6.5–8.1)

## 2020-11-18 LAB — CBC
HCT: 45.3 % (ref 39.0–52.0)
Hemoglobin: 14.4 g/dL (ref 13.0–17.0)
MCH: 30.4 pg (ref 26.0–34.0)
MCHC: 31.8 g/dL (ref 30.0–36.0)
MCV: 95.6 fL (ref 80.0–100.0)
Platelets: 271 10*3/uL (ref 150–400)
RBC: 4.74 MIL/uL (ref 4.22–5.81)
RDW: 13.1 % (ref 11.5–15.5)
WBC: 7.9 10*3/uL (ref 4.0–10.5)
nRBC: 0 % (ref 0.0–0.2)

## 2020-11-18 LAB — PROTIME-INR
INR: 1 (ref 0.8–1.2)
Prothrombin Time: 12.8 seconds (ref 11.4–15.2)

## 2020-11-18 LAB — SURGICAL PCR SCREEN
MRSA, PCR: NEGATIVE
Staphylococcus aureus: NEGATIVE

## 2020-11-18 LAB — HEMOGLOBIN A1C
Hgb A1c MFr Bld: 7.7 % — ABNORMAL HIGH (ref 4.8–5.6)
Mean Plasma Glucose: 174.29 mg/dL

## 2020-11-18 LAB — APTT: aPTT: 31 seconds (ref 24–36)

## 2020-11-18 NOTE — Progress Notes (Addendum)
Anesthesia Review:  PCP: DR Ronnald Ramp with [facility] at [facility]  Have requested by fax with medical release form most recent office visit notes and labs  Cardiologist : Dr Lake Bells O'neal- LOV- 03/07/20  Requested clearances if available from Dr Wynelle Link office.   Oncology- DR Martha Clan  Chest x-ray : EKG : 03/08/20  Echo : Stress test: Cardiac Cath :  Activity level: can do a flight of stairs without difficulty  Sleep Study/ CPAP : Fasting Blood Sugar :      / Checks Blood Sugar -- times a day:   Blood Thinner/ Instructions /Last Dose: ASA / Instructions/ Last Dose :  PT has Dexcom - glucose at preop was initially was 305 then decreased to 284.  PT reports he had just eaten lunch.   DM- type 2  hgba1c- 11/18/20- 7.7- routed to Dr Wynelle Link PT reports at preop appt that with last knee surgery his " glucose bottomed out am of surgery and his wife had to give him orange juice.   Pt reports glucose being up and down per pt . PT takes Novolog with meals and at hs.  Pt is aware to eat a good bedtime snack nite before surgery.  Food with protein in it .  Pt aware can have clear liguids until 0530am am of surgery and dirnnk one G2 lower sugar by 0530am.    PT is to take no Novolog at hs nite before surgery.    Am of surgery pt aware on instructionsi  If glucose am of surgery is greater than 220 to only take 4 units of Novolog and to make sure he drinks G2 lower sugar and to have some juice that am. 9 cranberry , apple or grape) PT voices understanding.  Spoke with Diabetic Coordinator, Sherlyn Hay and made her aware of above Per Diabetic coordinator, janine pt is to [place] take 1/2 of correction dose of NOvolog  Am of srugeryif glucose is over 220  which is 4 units and to be sure and drink juice that am along  with G2 lower sugar.  PT voices understanding.  PT is also aware to bring colostomy supplies with him day of surgery.  CMP done 11/18/20 routed to Dr Wynelle Link.

## 2020-11-25 ENCOUNTER — Other Ambulatory Visit (HOSPITAL_COMMUNITY)
Admission: RE | Admit: 2020-11-25 | Discharge: 2020-11-25 | Disposition: A | Discharge status: Home / Self Care | Payer: Medicare Other | Source: Ambulatory Visit | Attending: Orthopedic Surgery | Admitting: Orthopedic Surgery

## 2020-11-25 DIAGNOSIS — Z20822 Contact with and (suspected) exposure to covid-19: Secondary | ICD-10-CM | POA: Diagnosis not present

## 2020-11-25 DIAGNOSIS — Z01812 Encounter for preprocedural laboratory examination: Secondary | ICD-10-CM | POA: Diagnosis not present

## 2020-11-26 LAB — SARS CORONAVIRUS 2 (TAT 6-24 HRS): SARS Coronavirus 2: NEGATIVE

## 2020-11-26 MED ORDER — BUPIVACAINE LIPOSOME 1.3 % IJ SUSP
20.0000 mL | Freq: Once | INTRAMUSCULAR | Status: DC
  Filled 2020-11-26: qty 20

## 2020-11-26 NOTE — Anesthesia Preprocedure Evaluation (Addendum)
Anesthesia Evaluation  Patient identified by MRN, date of birth, ID band Patient awake    Reviewed: Allergy & Precautions, NPO status , Patient's Chart, lab work & pertinent test results  Airway Mallampati: II  TM Distance: >3 FB Neck ROM: Full    Dental  (+) Dental Advisory Given   Pulmonary former smoker,    breath sounds clear to auscultation       Cardiovascular negative cardio ROS   Rhythm:Regular Rate:Normal     Neuro/Psych  Neuromuscular disease    GI/Hepatic negative GI ROS, Neg liver ROS,   Endo/Other  diabetes, Type 2  Renal/GU negative Renal ROS     Musculoskeletal  (+) Arthritis ,   Abdominal   Peds  Hematology negative hematology ROS (+)   Anesthesia Other Findings   Reproductive/Obstetrics                            Lab Results  Component Value Date   WBC 7.9 11/18/2020   HGB 14.4 11/18/2020   HCT 45.3 11/18/2020   MCV 95.6 11/18/2020   PLT 271 11/18/2020   Lab Results  Component Value Date   CREATININE 0.75 11/18/2020   BUN 19 11/18/2020   NA 141 11/18/2020   K 4.8 11/18/2020   CL 108 11/18/2020   CO2 27 11/18/2020    Anesthesia Physical Anesthesia Plan  ASA: II  Anesthesia Plan: General   Post-op Pain Management:  Regional for Post-op pain   Induction: Intravenous  PONV Risk Score and Plan: 2 and Dexamethasone, Ondansetron and Treatment may vary due to age or medical condition  Airway Management Planned: LMA  Additional Equipment:   Intra-op Plan:   Post-operative Plan: Extubation in OR  Informed Consent: I have reviewed the patients History and Physical, chart, labs and discussed the procedure including the risks, benefits and alternatives for the proposed anesthesia with the patient or authorized representative who has indicated his/her understanding and acceptance.     Dental advisory given  Plan Discussed with: CRNA  Anesthesia Plan  Comments:        Anesthesia Quick Evaluation

## 2020-11-27 ENCOUNTER — Observation Stay (HOSPITAL_COMMUNITY)
Admission: RE | Admit: 2020-11-27 | Discharge: 2020-11-28 | Disposition: A | Discharge status: Home / Self Care | Payer: No Typology Code available for payment source | Attending: Orthopedic Surgery | Admitting: Orthopedic Surgery

## 2020-11-27 ENCOUNTER — Ambulatory Visit (HOSPITAL_COMMUNITY): Payer: No Typology Code available for payment source | Admitting: Physician Assistant

## 2020-11-27 ENCOUNTER — Other Ambulatory Visit: Payer: Self-pay

## 2020-11-27 ENCOUNTER — Encounter (HOSPITAL_COMMUNITY)
Admission: RE | Disposition: A | Discharge status: Home / Self Care | Payer: Self-pay | Source: Home / Self Care | Attending: Orthopedic Surgery

## 2020-11-27 ENCOUNTER — Encounter (HOSPITAL_COMMUNITY): Payer: Self-pay | Admitting: Orthopedic Surgery

## 2020-11-27 ENCOUNTER — Ambulatory Visit (HOSPITAL_COMMUNITY): Payer: No Typology Code available for payment source | Admitting: Anesthesiology

## 2020-11-27 DIAGNOSIS — Z96651 Presence of right artificial knee joint: Secondary | ICD-10-CM

## 2020-11-27 DIAGNOSIS — Z87891 Personal history of nicotine dependence: Secondary | ICD-10-CM | POA: Insufficient documentation

## 2020-11-27 DIAGNOSIS — E119 Type 2 diabetes mellitus without complications: Secondary | ICD-10-CM | POA: Insufficient documentation

## 2020-11-27 DIAGNOSIS — Z794 Long term (current) use of insulin: Secondary | ICD-10-CM | POA: Diagnosis not present

## 2020-11-27 DIAGNOSIS — M1711 Unilateral primary osteoarthritis, right knee: Secondary | ICD-10-CM | POA: Diagnosis not present

## 2020-11-27 DIAGNOSIS — G8929 Other chronic pain: Secondary | ICD-10-CM | POA: Diagnosis present

## 2020-11-27 DIAGNOSIS — M25561 Pain in right knee: Secondary | ICD-10-CM | POA: Diagnosis present

## 2020-11-27 DIAGNOSIS — Z7984 Long term (current) use of oral hypoglycemic drugs: Secondary | ICD-10-CM | POA: Diagnosis not present

## 2020-11-27 DIAGNOSIS — Z85040 Personal history of malignant carcinoid tumor of rectum: Secondary | ICD-10-CM | POA: Diagnosis not present

## 2020-11-27 HISTORY — PX: KNEE ARTHROTOMY: SHX5881

## 2020-11-27 LAB — GLUCOSE, CAPILLARY
Glucose-Capillary: 242 mg/dL — ABNORMAL HIGH (ref 70–99)
Glucose-Capillary: 236 mg/dL — ABNORMAL HIGH (ref 70–99)
Glucose-Capillary: 361 mg/dL — ABNORMAL HIGH (ref 70–99)
Glucose-Capillary: 326 mg/dL — ABNORMAL HIGH (ref 70–99)

## 2020-11-27 LAB — TYPE AND SCREEN
ABO/RH(D): A POS
Antibody Screen: NEGATIVE

## 2020-11-27 LAB — ABO/RH: ABO/RH(D): A POS

## 2020-11-27 SURGERY — ARTHROTOMY, KNEE
Anesthesia: General | Site: Knee | Laterality: Right

## 2020-11-27 MED ORDER — CEFAZOLIN SODIUM-DEXTROSE 2-4 GM/100ML-% IV SOLN
2.0000 g | INTRAVENOUS | Status: AC
  Administered 2020-11-27: 2 g via INTRAVENOUS
  Filled 2020-11-27: qty 100

## 2020-11-27 MED ORDER — POVIDONE-IODINE 10 % EX SWAB
2.0000 "application " | Freq: Once | CUTANEOUS | Status: AC
  Administered 2020-11-27: 2 via TOPICAL

## 2020-11-27 MED ORDER — ONDANSETRON HCL 4 MG/2ML IJ SOLN
INTRAMUSCULAR | Status: AC
  Filled 2020-11-27: qty 2

## 2020-11-27 MED ORDER — FENTANYL CITRATE (PF) 100 MCG/2ML IJ SOLN
INTRAMUSCULAR | Status: DC | PRN
  Administered 2020-11-27 (×2): 50 ug via INTRAVENOUS

## 2020-11-27 MED ORDER — TRAMADOL HCL 50 MG PO TABS: 50.0000 mg | ORAL_TABLET | Freq: Two times a day (BID) | ORAL | Status: DC

## 2020-11-27 MED ORDER — HYDROMORPHONE HCL 2 MG PO TABS
2.0000 mg | ORAL_TABLET | Freq: Four times a day (QID) | ORAL | Status: DC | PRN
  Administered 2020-11-27: 2 mg via ORAL
  Filled 2020-11-27: qty 1

## 2020-11-27 MED ORDER — DEXAMETHASONE SODIUM PHOSPHATE 10 MG/ML IJ SOLN
10.0000 mg | Freq: Once | INTRAMUSCULAR | Status: AC
  Administered 2020-11-28: 10 mg via INTRAVENOUS
  Filled 2020-11-27: qty 1

## 2020-11-27 MED ORDER — CHLORHEXIDINE GLUCONATE 0.12 % MT SOLN
15.0000 mL | Freq: Once | OROMUCOSAL | Status: AC
  Administered 2020-11-27: 15 mL via OROMUCOSAL

## 2020-11-27 MED ORDER — ORAL CARE MOUTH RINSE: 15.0000 mL | Freq: Once | OROMUCOSAL | Status: AC

## 2020-11-27 MED ORDER — GABAPENTIN 300 MG PO CAPS
300.0000 mg | ORAL_CAPSULE | Freq: Three times a day (TID) | ORAL | Status: DC
  Administered 2020-11-27 – 2020-11-28 (×3): 300 mg via ORAL
  Filled 2020-11-27 (×3): qty 1

## 2020-11-27 MED ORDER — CEFAZOLIN SODIUM-DEXTROSE 2-4 GM/100ML-% IV SOLN
2.0000 g | Freq: Four times a day (QID) | INTRAVENOUS | Status: AC
  Administered 2020-11-27 (×2): 2 g via INTRAVENOUS
  Filled 2020-11-27 (×2): qty 100

## 2020-11-27 MED ORDER — INSULIN ASPART 100 UNIT/ML ~~LOC~~ SOLN
0.0000 [IU] | Freq: Three times a day (TID) | SUBCUTANEOUS | Status: DC
  Administered 2020-11-27 – 2020-11-28 (×2): 15 [IU] via SUBCUTANEOUS

## 2020-11-27 MED ORDER — ONDANSETRON HCL 4 MG/2ML IJ SOLN: 4.0000 mg | Freq: Four times a day (QID) | INTRAMUSCULAR | Status: DC | PRN

## 2020-11-27 MED ORDER — DEXAMETHASONE SODIUM PHOSPHATE 10 MG/ML IJ SOLN
INTRAMUSCULAR | Status: AC
  Filled 2020-11-27: qty 1

## 2020-11-27 MED ORDER — METOCLOPRAMIDE HCL 5 MG/ML IJ SOLN
5.0000 mg | Freq: Three times a day (TID) | INTRAMUSCULAR | Status: DC | PRN
Start: 2020-11-27 — End: 2020-11-28

## 2020-11-27 MED ORDER — RIVAROXABAN 10 MG PO TABS
10.0000 mg | ORAL_TABLET | Freq: Every day | ORAL | Status: DC
  Administered 2020-11-28: 10 mg via ORAL
  Filled 2020-11-27: qty 1

## 2020-11-27 MED ORDER — BISACODYL 10 MG RE SUPP
10.0000 mg | Freq: Every day | RECTAL | Status: DC | PRN
Start: 2020-11-27 — End: 2020-11-28

## 2020-11-27 MED ORDER — PROPOFOL 10 MG/ML IV BOLUS
INTRAVENOUS | Status: DC | PRN
  Administered 2020-11-27: 150 mg via INTRAVENOUS

## 2020-11-27 MED ORDER — METHOCARBAMOL 500 MG PO TABS: 500.0000 mg | ORAL_TABLET | Freq: Four times a day (QID) | ORAL | 0 refills | Status: DC | PRN

## 2020-11-27 MED ORDER — PHENOL 1.4 % MT LIQD: 1.0000 | OROMUCOSAL | Status: DC | PRN

## 2020-11-27 MED ORDER — SODIUM CHLORIDE (PF) 0.9 % IJ SOLN
INTRAMUSCULAR | Status: AC
  Filled 2020-11-27: qty 10

## 2020-11-27 MED ORDER — ONDANSETRON HCL 4 MG PO TABS: 4.0000 mg | ORAL_TABLET | Freq: Four times a day (QID) | ORAL | Status: DC | PRN

## 2020-11-27 MED ORDER — HYDROMORPHONE HCL 2 MG PO TABS: 2.0000 mg | ORAL_TABLET | Freq: Four times a day (QID) | ORAL | 0 refills | Status: DC | PRN

## 2020-11-27 MED ORDER — SODIUM CHLORIDE (PF) 0.9 % IJ SOLN
INTRAMUSCULAR | Status: DC | PRN
  Administered 2020-11-27: 60 mL

## 2020-11-27 MED ORDER — METHOCARBAMOL 500 MG PO TABS
500.0000 mg | ORAL_TABLET | Freq: Four times a day (QID) | ORAL | Status: DC | PRN
  Administered 2020-11-27 – 2020-11-28 (×2): 500 mg via ORAL
  Filled 2020-11-27 (×2): qty 1

## 2020-11-27 MED ORDER — INSULIN ASPART 100 UNIT/ML ~~LOC~~ SOLN
SUBCUTANEOUS | Status: AC
  Administered 2020-11-27: 5 [IU] via SUBCUTANEOUS
  Filled 2020-11-27: qty 1

## 2020-11-27 MED ORDER — PROPOFOL 1000 MG/100ML IV EMUL
INTRAVENOUS | Status: AC
  Filled 2020-11-27: qty 100

## 2020-11-27 MED ORDER — SERTRALINE HCL 100 MG PO TABS
100.0000 mg | ORAL_TABLET | Freq: Every day | ORAL | Status: DC
  Administered 2020-11-27 – 2020-11-28 (×2): 100 mg via ORAL
  Filled 2020-11-27 (×2): qty 1

## 2020-11-27 MED ORDER — ACETAMINOPHEN 500 MG PO TABS: 1000.0000 mg | ORAL_TABLET | Freq: Once | ORAL | Status: DC

## 2020-11-27 MED ORDER — LACTATED RINGERS IV SOLN: INTRAVENOUS | Status: DC

## 2020-11-27 MED ORDER — ONDANSETRON HCL 4 MG PO TABS: 4.0000 mg | ORAL_TABLET | Freq: Every day | ORAL | 1 refills | Status: DC | PRN

## 2020-11-27 MED ORDER — DEXAMETHASONE SODIUM PHOSPHATE 10 MG/ML IJ SOLN
8.0000 mg | Freq: Once | INTRAMUSCULAR | Status: AC
  Administered 2020-11-27: 8 mg via INTRAVENOUS

## 2020-11-27 MED ORDER — DIPHENHYDRAMINE HCL 12.5 MG/5ML PO ELIX: 12.5000 mg | ORAL_SOLUTION | ORAL | Status: DC | PRN

## 2020-11-27 MED ORDER — FLEET ENEMA 7-19 GM/118ML RE ENEM: 1.0000 | ENEMA | Freq: Once | RECTAL | Status: DC | PRN

## 2020-11-27 MED ORDER — METHOCARBAMOL 500 MG IVPB - SIMPLE MED
500.0000 mg | Freq: Four times a day (QID) | INTRAVENOUS | Status: DC | PRN
  Filled 2020-11-27: qty 50

## 2020-11-27 MED ORDER — SODIUM CHLORIDE 0.9 % IR SOLN
Status: DC | PRN
  Administered 2020-11-27: 3000 mL

## 2020-11-27 MED ORDER — RIVAROXABAN 10 MG PO TABS: ORAL_TABLET | ORAL | 0 refills | Status: DC

## 2020-11-27 MED ORDER — EMPAGLIFLOZIN 25 MG PO TABS
25.0000 mg | ORAL_TABLET | Freq: Every day | ORAL | Status: DC
  Administered 2020-11-28: 25 mg via ORAL
  Filled 2020-11-27: qty 1

## 2020-11-27 MED ORDER — ONDANSETRON HCL 4 MG/2ML IJ SOLN
INTRAMUSCULAR | Status: DC | PRN
  Administered 2020-11-27: 4 mg via INTRAVENOUS

## 2020-11-27 MED ORDER — EPHEDRINE 5 MG/ML INJ
INTRAVENOUS | Status: AC
  Filled 2020-11-27: qty 10

## 2020-11-27 MED ORDER — MENTHOL 3 MG MT LOZG: 1.0000 | LOZENGE | OROMUCOSAL | Status: DC | PRN

## 2020-11-27 MED ORDER — POLYETHYLENE GLYCOL 3350 17 G PO PACK: 17.0000 g | PACK | Freq: Every day | ORAL | Status: DC | PRN

## 2020-11-27 MED ORDER — FLUOXETINE HCL 10 MG PO CAPS: 10.0000 mg | ORAL_CAPSULE | Freq: Every day | ORAL | Status: DC

## 2020-11-27 MED ORDER — FENTANYL CITRATE (PF) 100 MCG/2ML IJ SOLN
INTRAMUSCULAR | Status: AC
  Filled 2020-11-27: qty 2

## 2020-11-27 MED ORDER — FENTANYL CITRATE (PF) 100 MCG/2ML IJ SOLN: 25.0000 ug | INTRAMUSCULAR | Status: DC | PRN

## 2020-11-27 MED ORDER — ACETAMINOPHEN 10 MG/ML IV SOLN
1000.0000 mg | Freq: Four times a day (QID) | INTRAVENOUS | Status: DC
  Administered 2020-11-27: 1000 mg via INTRAVENOUS
  Filled 2020-11-27: qty 100

## 2020-11-27 MED ORDER — ROPIVACAINE HCL 5 MG/ML IJ SOLN
INTRAMUSCULAR | Status: DC | PRN
  Administered 2020-11-27: 20 mL via PERINEURAL

## 2020-11-27 MED ORDER — ACETAMINOPHEN 325 MG PO TABS: 325.0000 mg | ORAL_TABLET | Freq: Four times a day (QID) | ORAL | Status: DC | PRN

## 2020-11-27 MED ORDER — MIDAZOLAM HCL 2 MG/2ML IJ SOLN
INTRAMUSCULAR | Status: AC
  Filled 2020-11-27: qty 2

## 2020-11-27 MED ORDER — BUPIVACAINE LIPOSOME 1.3 % IJ SUSP
INTRAMUSCULAR | Status: DC | PRN
  Administered 2020-11-27: 20 mL

## 2020-11-27 MED ORDER — SODIUM CHLORIDE 0.9 % IV SOLN: INTRAVENOUS | Status: DC

## 2020-11-27 MED ORDER — MIDAZOLAM HCL 2 MG/2ML IJ SOLN
INTRAMUSCULAR | Status: DC | PRN
  Administered 2020-11-27: 2 mg via INTRAVENOUS

## 2020-11-27 MED ORDER — PROPOFOL 10 MG/ML IV BOLUS
INTRAVENOUS | Status: AC
  Filled 2020-11-27: qty 20

## 2020-11-27 MED ORDER — LIDOCAINE 2% (20 MG/ML) 5 ML SYRINGE
INTRAMUSCULAR | Status: DC | PRN
  Administered 2020-11-27: 50 mg via INTRAVENOUS

## 2020-11-27 MED ORDER — TRAMADOL HCL 50 MG PO TABS
50.0000 mg | ORAL_TABLET | Freq: Two times a day (BID) | ORAL | Status: DC
  Administered 2020-11-27 – 2020-11-28 (×2): 50 mg via ORAL
  Filled 2020-11-27 (×3): qty 1

## 2020-11-27 MED ORDER — DOCUSATE SODIUM 100 MG PO CAPS
100.0000 mg | ORAL_CAPSULE | Freq: Two times a day (BID) | ORAL | Status: DC
  Administered 2020-11-27 – 2020-11-28 (×3): 100 mg via ORAL
  Filled 2020-11-27 (×3): qty 1

## 2020-11-27 MED ORDER — EPHEDRINE SULFATE-NACL 50-0.9 MG/10ML-% IV SOSY
PREFILLED_SYRINGE | INTRAVENOUS | Status: DC | PRN
  Administered 2020-11-27: 10 mg via INTRAVENOUS

## 2020-11-27 MED ORDER — AMISULPRIDE (ANTIEMETIC) 5 MG/2ML IV SOLN: 10.0000 mg | Freq: Once | INTRAVENOUS | Status: DC | PRN

## 2020-11-27 MED ORDER — METOCLOPRAMIDE HCL 5 MG PO TABS: 5.0000 mg | ORAL_TABLET | Freq: Three times a day (TID) | ORAL | Status: DC | PRN

## 2020-11-27 MED ORDER — ATORVASTATIN CALCIUM 40 MG PO TABS
40.0000 mg | ORAL_TABLET | Freq: Every day | ORAL | Status: DC
  Filled 2020-11-27: qty 1

## 2020-11-27 SURGICAL SUPPLY — 60 items
BAG DECANTER FOR FLEXI CONT (MISCELLANEOUS) ×2 IMPLANT
BAG SPEC THK2 15X12 ZIP CLS (MISCELLANEOUS)
BAG ZIPLOCK 12X15 (MISCELLANEOUS) IMPLANT
BLADE SAG 18X100X1.27 (BLADE) ×2 IMPLANT
BLADE SAW SGTL 11.0X1.19X90.0M (BLADE) ×2 IMPLANT
BLADE SURG SZ10 CARB STEEL (BLADE) ×4 IMPLANT
BNDG ELASTIC 6X5.8 VLCR STR LF (GAUZE/BANDAGES/DRESSINGS) ×2 IMPLANT
CLOTH BEACON ORANGE TIMEOUT ST (SAFETY) ×2 IMPLANT
COVER SURGICAL LIGHT HANDLE (MISCELLANEOUS) ×2 IMPLANT
COVER WAND RF STERILE (DRAPES) IMPLANT
CUFF TOURN SGL QUICK 34 (TOURNIQUET CUFF) ×2
CUFF TRNQT CYL 34X4.125X (TOURNIQUET CUFF) ×1 IMPLANT
DECANTER SPIKE VIAL GLASS SM (MISCELLANEOUS) IMPLANT
DRAPE U-SHAPE 47X51 STRL (DRAPES) ×2 IMPLANT
DRSG ADAPTIC 3X8 NADH LF (GAUZE/BANDAGES/DRESSINGS) ×2 IMPLANT
DRSG AQUACEL AG ADV 3.5X10 (GAUZE/BANDAGES/DRESSINGS) ×1 IMPLANT
DRSG PAD ABDOMINAL 8X10 ST (GAUZE/BANDAGES/DRESSINGS) ×2 IMPLANT
DURAPREP 26ML APPLICATOR (WOUND CARE) ×2 IMPLANT
ELECT REM PT RETURN 15FT ADLT (MISCELLANEOUS) ×2 IMPLANT
EVACUATOR 1/8 PVC DRAIN (DRAIN) ×2 IMPLANT
GAUZE SPONGE 4X4 12PLY STRL (GAUZE/BANDAGES/DRESSINGS) ×2 IMPLANT
GLOVE SRG 8 PF TXTR STRL LF DI (GLOVE) ×1 IMPLANT
GLOVE SURG ENC MOIS LTX SZ6 (GLOVE) IMPLANT
GLOVE SURG ENC MOIS LTX SZ6.5 (GLOVE) ×4 IMPLANT
GLOVE SURG ENC MOIS LTX SZ8 (GLOVE) ×4 IMPLANT
GLOVE SURG UNDER POLY LF SZ7 (GLOVE) ×2 IMPLANT
GLOVE SURG UNDER POLY LF SZ8 (GLOVE) ×2
GLOVE SURG UNDER POLY LF SZ8.5 (GLOVE) IMPLANT
GOWN STRL REUS W/TWL LRG LVL3 (GOWN DISPOSABLE) ×4 IMPLANT
HANDPIECE INTERPULSE COAX TIP (DISPOSABLE) ×2
HOLDER FOLEY CATH W/STRAP (MISCELLANEOUS) ×1 IMPLANT
IMMOBILIZER KNEE 20 (SOFTGOODS) ×2
IMMOBILIZER KNEE 20 THIGH 36 (SOFTGOODS) ×1 IMPLANT
INSERT TIB HI FLEX SZ 5-6 10 (Insert) ×1 IMPLANT
KIT TURNOVER KIT A (KITS) ×2 IMPLANT
MANIFOLD NEPTUNE II (INSTRUMENTS) ×2 IMPLANT
NS IRRIG 1000ML POUR BTL (IV SOLUTION) ×3 IMPLANT
PACK TOTAL KNEE CUSTOM (KITS) ×2 IMPLANT
PADDING CAST ABS 6INX4YD NS (CAST SUPPLIES) ×2
PADDING CAST ABS COTTON 6X4 NS (CAST SUPPLIES) IMPLANT
PADDING CAST COTTON 6X4 STRL (CAST SUPPLIES) ×4 IMPLANT
PENCIL SMOKE EVACUATOR (MISCELLANEOUS) ×1 IMPLANT
PROTECTOR NERVE ULNAR (MISCELLANEOUS) ×2 IMPLANT
SET HNDPC FAN SPRY TIP SCT (DISPOSABLE) ×1 IMPLANT
STRIP CLOSURE SKIN 1/2X4 (GAUZE/BANDAGES/DRESSINGS) ×3 IMPLANT
SUT MNCRL AB 4-0 PS2 18 (SUTURE) ×2 IMPLANT
SUT STRATAFIX 0 PDS 27 VIOLET (SUTURE) ×2
SUT VIC AB 2-0 CT1 27 (SUTURE) ×6
SUT VIC AB 2-0 CT1 TAPERPNT 27 (SUTURE) ×3 IMPLANT
SUTURE STRATFX 0 PDS 27 VIOLET (SUTURE) ×1 IMPLANT
SWAB COLLECTION DEVICE MRSA (MISCELLANEOUS) IMPLANT
SWAB CULTURE ESWAB REG 1ML (MISCELLANEOUS) IMPLANT
SYR 50ML LL SCALE MARK (SYRINGE) ×4 IMPLANT
TOWER CARTRIDGE SMART MIX (DISPOSABLE) ×2 IMPLANT
TRAY FOLEY MTR SLVR 16FR STAT (SET/KITS/TRAYS/PACK) ×2 IMPLANT
TUBE KAMVAC SUCTION (TUBING) IMPLANT
TUBE SUCTION HIGH CAP CLEAR NV (SUCTIONS) ×2 IMPLANT
WATER STERILE IRR 1000ML POUR (IV SOLUTION) ×2 IMPLANT
WRAP KNEE MAXI GEL POST OP (GAUZE/BANDAGES/DRESSINGS) ×1 IMPLANT
size 5-6 10mm legion ps xlpe high flexion articula (Orthopedic Implant) ×1 IMPLANT

## 2020-11-27 NOTE — Discharge Instructions (Addendum)
Leroy Arabian, MD [facility] [address] [place], [place] [address] [phone]  POSTOPERATIVE DIRECTIONS  Knee Rehabilitation, Guidelines Following Surgery  Results after knee surgery are often greatly improved when you follow the exercise, range of motion and muscle strengthening exercises prescribed by your doctor. Safety measures are also important to protect the knee from further injury. If any of these exercises cause you to have increased pain or swelling in your knee joint, decrease the amount until you are comfortable again and slowly increase them. If you have problems or questions, call your caregiver or physical therapist for advice.   BLOOD CLOT PREVENTION . Take a 10 mg Xarelto once a day for three weeks following surgery. Then take an 81 mg Aspirin once a day for three weeks. Then discontinue Aspirin. . You may resume your vitamins/supplements once you have discontinued the Xarelto. . Do not take any NSAIDs (Advil, Aleve, Ibuprofen, Meloxicam, etc.) until you have discontinued the Xarelto.   HOME CARE INSTRUCTIONS  . Remove items at home which could result in a fall. This includes throw rugs or furniture in walking pathways.  . ICE to the affected knee as much as tolerated. Icing helps control swelling. If the swelling is well controlled you will be more comfortable and rehab easier. Continue to use ice on the knee for pain and swelling from surgery. You may notice swelling that will progress down to the foot and ankle. This is normal after surgery. Elevate the leg when you are not up walking on it.    . Continue to use the breathing machine which will help keep your temperature down. It is common for your temperature to cycle up and down following surgery, especially at night when you are not up moving around and exerting yourself. The breathing machine keeps your lungs expanded and your temperature down. . Do not place  pillow under the operative knee, focus on keeping the knee straight while resting  DIET You may resume your previous home diet once you are discharged from the hospital.  DRESSING / [place] / SHOWERING . Keep your bulky bandage on for 2 days. On the third post-operative day you may remove the Ace bandage and gauze. There is a waterproof adhesive bandage on your skin which will stay in place until your first follow-up appointment. Once you remove this you will not need to place another bandage . You may begin showering 3 days following surgery, but do not submerge the incision under water.  ACTIVITY For the first 5 days, the key is rest and control of pain and swelling . Do your home exercises twice a day starting on post-operative day 3. On the days you go to physical therapy, just do the home exercises once that day. . You should rest, ice and elevate the leg for 50 minutes out of every hour. Get up and walk/stretch for 10 minutes per hour. After 5 days you can increase your activity slowly as tolerated. . Walk with your walker as instructed. Use the walker until you are comfortable transitioning to a cane. Walk with the cane in the opposite hand of the operative leg. You may discontinue the cane once you are comfortable and walking steadily. . Avoid periods of inactivity such as sitting longer than an hour when not asleep. This helps prevent blood clots.  . You may discontinue the knee immobilizer once you are able to perform a straight leg raise while lying down. Marland Kitchen  You may resume a sexual relationship in one month or when given the OK by your doctor.  . You may return to work once you are cleared by your doctor.  . Do not drive a car for 6 weeks or until released by your surgeon.  . Do not drive while taking narcotics.  TED HOSE STOCKINGS Wear the elastic stockings on both legs for three weeks following surgery during the day. You may remove them at night for sleeping.  WEIGHT  BEARING Weight bearing as tolerated with assist device (walker, cane, etc) as directed, use it as long as suggested by your surgeon or therapist, typically at least 4-6 weeks.  POSTOPERATIVE CONSTIPATION PROTOCOL Constipation - defined medically as fewer than three stools per week and severe constipation as less than one stool per week.  One of the most common issues patients have following surgery is constipation.  Even if you have a regular bowel pattern at home, your normal regimen is likely to be disrupted due to multiple reasons following surgery.  Combination of anesthesia, postoperative narcotics, change in appetite and fluid intake all can affect your bowels.  In order to avoid complications following surgery, here are some recommendations in order to help you during your recovery period.  . Colace (docusate) - Pick up an over-the-counter form of Colace or another stool softener and take twice a day as long as you are requiring postoperative pain medications.  Take with a full glass of water daily.  If you experience loose stools or diarrhea, hold the colace until you stool forms back up. If your symptoms do not get better within 1 week or if they get worse, check with your doctor. . Dulcolax (bisacodyl) - Pick up over-the-counter and take as directed by the product packaging as needed to assist with the movement of your bowels.  Take with a full glass of water.  Use this product as needed if not relieved by Colace only.  . MiraLax (polyethylene glycol) - Pick up over-the-counter to have on hand. MiraLax is a solution that will increase the amount of water in your bowels to assist with bowel movements.  Take as directed and can mix with a glass of water, juice, soda, coffee, or tea. Take if you go more than two days without a movement. Do not use MiraLax more than once per day. Call your doctor if you are still constipated or irregular after using this medication for 7 days in a row.  If you  continue to have problems with postoperative constipation, please contact the office for further assistance and recommendations.  If you experience "the worst abdominal pain ever" or develop nausea or vomiting, please contact the office immediatly for further recommendations for treatment.  ITCHING If you experience itching with your medications, try taking only a single pain pill, or even half a pain pill at a time.  You can also use Benadryl over the counter for itching or also to help with sleep.   MEDICATIONS See your medication summary on the "After Visit Summary" that the nursing staff will review with you prior to discharge.  You may have some home medications which will be placed on hold until you complete the course of blood thinner medication.  It is important for you to complete the blood thinner medication as prescribed by your surgeon.  Continue your approved medications as instructed at time of discharge.  PRECAUTIONS . If you experience chest pain or shortness of breath - call 911 immediately for  transfer to the hospital emergency department.  . If you develop a fever greater that 101 F, purulent drainage from wound, increased redness or drainage from wound, foul odor from the wound/dressing, or calf pain - CONTACT YOUR SURGEON.                                                   FOLLOW-UP APPOINTMENTS Make sure you keep all of your appointments after your operation with your surgeon and caregivers. You should call the office at the above phone number and make an appointment for approximately two weeks after the date of your surgery or on the date instructed by your surgeon outlined in the "After Visit Summary".  RANGE OF MOTION AND STRENGTHENING EXERCISES  Rehabilitation of the knee is important following a knee injury or an operation. After just a few days of immobilization, the muscles of the thigh which control the knee become weakened and shrink (atrophy). Knee exercises are  designed to build up the tone and strength of the thigh muscles and to improve knee motion. Often times heat used for twenty to thirty minutes before working out will loosen up your tissues and help with improving the range of motion but do not use heat for the first two weeks following surgery. These exercises can be done on a training (exercise) mat, on the floor, on a table or on a bed. Use what ever works the best and is most comfortable for you Knee exercises include:  . Leg Lifts - While your knee is still immobilized in a splint or cast, you can do straight leg raises. Lift the leg to 60 degrees, hold for 3 sec, and slowly lower the leg. Repeat 10-20 times 2-3 times daily. Perform this exercise against resistance later as your knee gets better.  Javier Docker and Hamstring Sets - Tighten up the muscle on the front of the thigh (Quad) and hold for 5-10 sec. Repeat this 10-20 times hourly. Hamstring sets are done by pushing the foot backward against an object and holding for 5-10 sec. Repeat as with quad sets.   Leg Slides: Lying on your back, slowly slide your foot toward your buttocks, bending your knee up off the floor (only go as far as is comfortable). Then slowly slide your foot back down until your leg is flat on the floor again.  Angel Wings: Lying on your back spread your legs to the side as far apart as you can without causing discomfort.  A rehabilitation program following serious knee injuries can speed recovery and prevent re-injury in the future due to weakened muscles. Contact your doctor or a physical therapist for more information on knee rehabilitation.   POST-OPERATIVE OPIOID TAPER INSTRUCTIONS: . It is important to wean off of your opioid medication as soon as possible. If you do not need pain medication after your surgery it is ok to stop day one. Marland Kitchen Opioids include: o Codeine, Hydrocodone(Norco, Vicodin), Oxycodone(Percocet, oxycontin) and hydromorphone amongst others.  . Long term  and even short term use of opiods can cause: o Increased pain response o Dependence o Constipation o Depression o Respiratory depression o And more.  . Withdrawal symptoms can include o Flu like symptoms o Nausea, vomiting o And more . Techniques to manage these symptoms o Hydrate well o Eat regular healthy meals o Stay active o  Use relaxation techniques(deep breathing, meditating, yoga) . Do Not substitute Alcohol to help with tapering . If you have been on opioids for less than two weeks and do not have pain than it is ok to stop all together.  . Plan to wean off of opioids o This plan should start within one week post op of your joint replacement. o Maintain the same interval or time between taking each dose and first decrease the dose.  o Cut the total daily intake of opioids by one tablet each day o Next start to increase the time between doses. o The last dose that should be eliminated is the evening dose.     IF YOU ARE TRANSFERRED TO A SKILLED REHAB FACILITY If the patient is transferred to a skilled rehab facility following release from the hospital, a list of the current medications will be sent to the facility for the patient to continue.  When discharged from the skilled rehab facility, please have the facility set up the patient's Onycha prior to being released. Also, the skilled facility will be responsible for providing the patient with their medications at time of release from the facility to include their pain medication, the muscle relaxants, and their blood thinner medication. If the patient is still at the rehab facility at time of the two week follow up appointment, the skilled rehab facility will also need to assist the patient in arranging follow up appointment in our office and any transportation needs.  MAKE SURE YOU:  . Understand these instructions.  . Get help right away if you are not doing well or get worse.    Pick up stool  softner and laxative for home use following surgery while on pain medications. Do not submerge incision under water. Please use good hand washing techniques while changing dressing each day. May shower starting three days after surgery. Please use a clean towel to pat the incision dry following showers. Continue to use ice for pain and swelling after surgery. Do not use any lotions or creams on the incision until instructed by your surgeon.   Information on my medicine - XARELTO (Rivaroxaban)  Why was Xarelto prescribed for you? Xarelto was prescribed for you to reduce the risk of blood clots forming after orthopedic surgery. The medical term for these abnormal blood clots is venous thromboembolism (VTE).  What do you need to know about xarelto ? Take your Xarelto ONCE DAILY at the same time every day. You may take it either with or without food.  If you have difficulty swallowing the tablet whole, you may crush it and mix in applesauce just prior to taking your dose.  Take Xarelto exactly as prescribed by your doctor and DO NOT stop taking Xarelto without talking to the doctor who prescribed the medication.  Stopping without other VTE prevention medication to take the place of Xarelto may increase your risk of developing a clot.  After discharge, you should have regular check-up appointments with your healthcare provider that is prescribing your Xarelto.    What do you do if you miss a dose? If you miss a dose, take it as soon as you remember on the same day then continue your regularly scheduled once daily regimen the next day. Do not take two doses of Xarelto on the same day.   Important Safety Information A possible side effect of Xarelto is bleeding. You should call your healthcare provider right away if you experience any of the following: ?  Bleeding from an injury or your nose that does not stop. ? Unusual colored urine (red or dark brown) or unusual colored stools  (red or black). ? Unusual bruising for unknown reasons. ? A serious fall or if you hit your head (even if there is no bleeding).  Some medicines may interact with Xarelto and might increase your risk of bleeding while on Xarelto. To help avoid this, consult your healthcare provider or pharmacist prior to using any new prescription or non-prescription medications, including herbals, vitamins, non-steroidal anti-inflammatory drugs (NSAIDs) and supplements.  This website has more information on Xarelto: [email or web address]

## 2020-11-27 NOTE — Evaluation (Signed)
Physical Therapy Evaluation Patient Details Name: Leroy Jones MRN: [id number] DOB: 08/08/1946 Today's Date: 11/27/2020   History of Present Illness  Patient is 75 y.o. Rt knee arthrotomy, scar excision and poly revision on 11/27/20. PMH significant for DM, OA, neuropathy, rectal cancer with colostomy.    Clinical Impression  SOFIA JAQUITH is a 75 y.o. male POD 0 s/p Rt TKR (arthotomy, scar excision, possible poly exchange). Patient reports independence with mobility at baseline. Patient is now limited by functional impairments (see PT problem list below) and requires min assist/guard for transfers and gait with RW. Patient was able to ambulate ~80 feet with RW and min guard/assist. Patient instructed in exercise to facilitate circulation to manage edema and reduce DVT risk. Educated pt to perform heel slides to keep knee from feeling stiff and to keep knee straight at rest. Patient will benefit from continued skilled PT interventions to address impairments and progress towards PLOF. Acute PT will follow to progress mobility and stair training in preparation for safe discharge home.     Follow Up Recommendations Follow surgeon's recommendation for DC plan and follow-up therapies;Outpatient PT    Equipment Recommendations  None recommended by PT    Recommendations for Other Services       Precautions / Restrictions Precautions Precautions: Fall Restrictions Weight Bearing Restrictions: No Other Position/Activity Restrictions: WBAT      Mobility  Bed Mobility Overal bed mobility: Needs Assistance Bed Mobility: Supine to Sit     Supine to sit: Supervision;HOB elevated     General bed mobility comments: pt taking some extra time to sit EOB, no assist needed for Rt LE, pt using bed rail.    Transfers Overall transfer level: Needs assistance Equipment used: Rolling walker (2 wheeled) Transfers: Sit to/from Stand Sit to Stand: Min assist         General transfer comment:  cues for hand placement with power up, assist to initiate and complete rise to RW. pt steady once standing.  Ambulation/Gait Ambulation/Gait assistance: Min assist;Min guard Gait Distance (Feet): 80 Feet Assistive device: Rolling walker (2 wheeled) Gait Pattern/deviations: Step-through pattern;Decreased weight shift to right Gait velocity: decr   General Gait Details: cues for proximity to RW, pt steady with step through pattern, no overt LOB or buckling at Rt knee. progressed to min guard for safety.  Stairs            Wheelchair Mobility    Modified Rankin (Stroke Patients Only)       Balance Overall balance assessment: Mild deficits observed, not formally tested                                           Pertinent Vitals/Pain Pain Assessment: 0-10 Pain Score: 3  Pain Location: Rt knee Pain Descriptors / Indicators: Aching;Discomfort Pain Intervention(s): Limited activity within patient's tolerance;Monitored during session;Premedicated before session;Repositioned;Ice applied    Home Living Family/patient expects to be discharged to:: Private residence Living Arrangements: Spouse/significant other Available Help at Discharge: Family Type of Home: House Home Access: Stairs to enter Entrance Stairs-Rails: None Entrance Stairs-Number of Steps: 1+1 Home Layout: One level Home Equipment: Environmental consultant - 2 wheels;Cane - single point;Bedside commode;Shower seat;Grab bars - tub/shower      Prior Function Level of Independence: Independent               Hand Dominance   Dominant Hand: Right  Extremity/Trunk Assessment   Upper Extremity Assessment Upper Extremity Assessment: Overall WFL for tasks assessed    Lower Extremity Assessment Lower Extremity Assessment: RLE deficits/detail RLE Deficits / Details: good quad activation, no extensor lag with SLR RLE Sensation: WNL;history of peripheral neuropathy RLE Coordination: WNL    Cervical /  Trunk Assessment Cervical / Trunk Assessment: Normal  Communication   Communication: No difficulties  Cognition Arousal/Alertness: Awake/alert Behavior During Therapy: WFL for tasks assessed/performed Overall Cognitive Status: Within Functional Limits for tasks assessed                                        General Comments      Exercises Total Joint Exercises Ankle Circles/Pumps: AROM;Both;20 reps;Seated   Assessment/Plan    PT Assessment Patient needs continued PT services  PT Problem List Decreased strength;Decreased range of motion;Decreased activity tolerance;Decreased balance;Decreased mobility;Decreased knowledge of use of DME;Decreased knowledge of precautions       PT Treatment Interventions DME instruction;Gait training;Stair training;Functional mobility training;Therapeutic exercise;Therapeutic activities;Neuromuscular re-education;Balance training;Patient/family education    PT Goals (Current goals can be found in the Care Plan section)  Acute Rehab PT Goals Patient Stated Goal: get knee moving better PT Goal Formulation: With patient Time For Goal Achievement: 12/04/20 Potential to Achieve Goals: Good    Frequency 7X/week   Barriers to discharge        Co-evaluation               AM-PAC PT "6 Clicks" Mobility  Outcome Measure Help needed turning from your back to your side while in a flat bed without using bedrails?: None Help needed moving from lying on your back to sitting on the side of a flat bed without using bedrails?: A Little Help needed moving to and from a bed to a chair (including a wheelchair)?: A Little Help needed standing up from a chair using your arms (e.g., wheelchair or bedside chair)?: A Little Help needed to walk in hospital room?: A Little Help needed climbing 3-5 steps with a railing? : A Little 6 Click Score: 19    End of Session Equipment Utilized During Treatment: Gait belt Activity Tolerance: Patient  tolerated treatment well Patient left: in chair;with call bell/phone within reach;with chair alarm set Nurse Communication: Mobility status PT Visit Diagnosis: Muscle weakness (generalized) (M62.81);Difficulty in walking, not elsewhere classified (R26.2)    Time: 2585-2778 PT Time Calculation (min) (ACUTE ONLY): 26 min   Charges:   PT Evaluation $PT Eval Low Complexity: 1 Low PT Treatments $Gait Training: 8-22 mins        Verner Mould, DPT Acute Rehabilitation Services Office [phone] Pager [phone]    Jacques Navy 11/27/2020, 2:22 PM

## 2020-11-27 NOTE — Transfer of Care (Signed)
Immediate Anesthesia Transfer of Care Note  Patient: DORA CLAUSS  Procedure(s) Performed: Right knee arthrotomy, scar excision, possibly polyethylene revision (Right Knee)  Patient Location: PACU  Anesthesia Type:Spinal  Level of Consciousness: awake and alert   Airway & Oxygen Therapy: Patient Spontanous Breathing and Patient connected to face mask oxygen  Post-op Assessment: Report given to RN and Post -op Vital signs reviewed and stable  Post vital signs: Reviewed and stable  Last Vitals:  Vitals Value Taken Time  BP 147/75 11/27/20 1000  Temp    Pulse 73 11/27/20 1000  Resp 20 11/27/20 1000  SpO2 100 % 11/27/20 1000  Vitals shown include unvalidated device data.  Last Pain:  Vitals:   11/27/20 0652  TempSrc:   PainSc: 0-No pain      Patients Stated Pain Goal: 3 (78/46/96 2952)  Complications: No complications documented.

## 2020-11-27 NOTE — Progress Notes (Signed)
Patients pharmacy is [facility] on [address] in [place]

## 2020-11-27 NOTE — H&P (Signed)
RIGHT KNEE ARTHROTOMY, SCAR EXCISION, POSSIBLE POLYETHYLENE REVISION  ADMISSION H&P  Patient is being admitted for right knee arthrotomy, scar excision, possible polyehtylene revision.  Subjective:  Chief Complaint: Right knee pain.  HPI: Leroy Jones, 75 y.o. male has a history of pain and functional disability in the right knee due to arthritis and has failed non-surgical conservative treatments for greater than 12 weeks to include activity modification. Onset of symptoms was gradual, starting recently with rapidlly worsening course since that time. The patient noted prior procedures on the knee to include  arthroplasty on the right knee.  Patient currently rates pain in the right knee at 5 out of 10 with activity. Patient has worsening of pain with activity and weight bearing, pain that interferes with activities of daily living and instability. AP and lateral of the bilateral knees dated 09/12/2020 demonstrate the left knee has medial joint space narrowing but is not fully bone on bone. The lateral and patellofemoral look good. The right knee shows a Smith and Nephew prosthesis in good position. No periprosthetic abnormalities. There is no active infection.  Patient Active Problem List   Diagnosis Date Noted  . Chronic pain of right knee 03/06/2016  . Near syncope 05/10/2013  . Hyperkalemia 05/10/2013  . Leukocytosis, unspecified 05/10/2013  . Rectal cancer ([place]) 09/17/2011    Past Medical History:  Diagnosis Date  . Arthritis   . Diabetes mellitus without complication ([place])    type 2   . Peripheral neuropathy    arms and legs per pt   . PONV (postoperative nausea and vomiting)    with knee scope surgery   . Rectal cancer ([place]) 09/17/2011   colostomy     Past Surgical History:  Procedure Laterality Date  . bilateral cataract surgery     . CHOLECYSTECTOMY    . colon cancer    . COLOSTOMY    . KNEE SURGERY      Prior to Admission medications   Medication Sig Start Date  End Date Taking? Authorizing Provider  acetaminophen (TYLENOL) 325 MG tablet Take 650 mg by mouth every 6 (six) hours as needed for mild pain or fever.   Yes [provider]  ascorbic acid (VITAMIN C) 500 MG tablet Take 500 mg by mouth daily.   Yes [provider]  atorvastatin (LIPITOR) 40 MG tablet Take 40 mg by mouth at bedtime.   Yes [provider]  cyanocobalamin (,VITAMIN B-12,) 1000 MCG/ML injection Inject 1,000 mcg into the muscle every 30 (thirty) days.   Yes [provider]  empagliflozin (JARDIANCE) 25 MG TABS tablet Take 12.5 mg by mouth daily.   Yes [provider]  ferrous sulfate 325 (65 FE) MG tablet Take 325 mg by mouth 3 (three) times a week.   Yes [provider]  FLUoxetine (PROZAC) 10 MG capsule Take 10 mg by mouth daily.   Yes [provider]  gabapentin (NEURONTIN) 300 MG capsule Take 300 mg by mouth 3 (three) times daily.   Yes [provider]  glucose 4 GM chewable tablet Chew 4 tablets by mouth as needed for low blood sugar (Repeat every 15 minutes if blood sugar less than 70).   Yes [provider]  insulin aspart (NOVOLOG FLEXPEN) 100 UNIT/ML FlexPen Inject 10-12 Units into the skin 4 (four) times daily -  with meals and at bedtime. Sliding scale- 0-80=0, 80-100=5, 101-140=6, 141-180=7, 181-220=8, 221-260=9, 261-320=10, 321-340=11, over = 12 units per pt at preop appt  Yes [provider]  insulin glargine (LANTUS) 100 UNIT/ML injection Inject 10 Units into the skin at bedtime.   Yes [provider]  Magnesium Oxide 420 (252 Mg) MG TABS Take 420 mg by mouth daily.   Yes [provider]  meclizine (ANTIVERT) 25 MG tablet Take 25 mg by mouth 3 (three) times daily as needed for dizziness.    Yes [provider]  meloxicam (MOBIC) 15 MG tablet Take 15 mg by mouth daily.   Yes [provider]  metFORMIN (GLUCOPHAGE) 500 MG tablet Take 1,000 mg by mouth  2 (two) times daily with a meal.   Yes [provider]  sertraline (ZOLOFT) 100 MG tablet Take 100 mg by mouth daily.   Yes [provider]  vitamin B-12 (CYANOCOBALAMIN) 500 MCG tablet Take 500 mcg by mouth daily.   Yes [provider]    Allergies  Allergen Reactions  . Codeine Nausea And Vomiting  . Oxycodone Hcl Nausea And Vomiting    Social History   Socioeconomic History  . Marital status: Married    Spouse name: Not on file  . Number of children: 2  . Years of education: Not on file  . Highest education level: Not on file  Occupational History  . Occupation: retired  Tobacco Use  . Smoking status: Former Smoker    Years: 15.00    Quit date: 03/07/1980    Years since quitting: 40.7  . Smokeless tobacco: Never Used  . Tobacco comment: never used tobacco  Vaping Use  . Vaping Use: Never used  Substance and Sexual Activity  . Alcohol use: No    Alcohol/week: 0.0 standard drinks  . Drug use: No  . Sexual activity: Not on file  Other Topics Concern  . Not on file  Social History Narrative  . Not on file   Social Determinants of Health   Financial Resource Strain: Not on file  Food Insecurity: Not on file  Transportation Needs: Not on file  Physical Activity: Not on file  Stress: Not on file  Social Connections: Not on file  Intimate Partner Violence: Not on file    Tobacco Use: Medium Risk  . Smoking Tobacco Use: Former Smoker  . Smokeless Tobacco Use: Never Used   Social History   Substance and Sexual Activity  Alcohol Use No  . Alcohol/week: 0.0 standard drinks    Family History  Problem Relation Age of Onset  . Heart attack Father     ROS: Constitutional: no fever, no chills, no night sweats, no significant weight loss Cardiovascular: no chest pain, no palpitations Respiratory: no cough, no shortness of breath, No COPD Gastrointestinal: no vomiting, no nausea Musculoskeletal: no swelling in Joints, Joint  Pain Neurologic: no numbness, no tingling, no difficulty with balance   Objective:  Physical Exam: Well nourished and well developed.  General: Alert and oriented x3, cooperative and pleasant, no acute distress.  Head: normocephalic, atraumatic, neck supple.  Eyes: EOMI.  Respiratory: breath sounds clear in all fields, no wheezing, rales, or rhonchi. Cardiovascular: Regular rate and rhythm, no murmurs, gallops or rubs.  Abdomen: non-tender to palpation and soft, normoactive bowel sounds. Musculoskeletal:  Right Knee Exam:  No warmth present. No swelling present.  Range of motion: 0 to 75 degrees with a firm endpoint.  No crepitus on range of motion of the knee.  No medial joint line tenderness.  No lateral joint line tenderness.  The knee is stable.    Left Knee  Exam:  No effusion present. No swelling present.  Range of motion is: 0 to 125 degrees.  No crepitus on range of motion of the knee.  Slight medial joint line tenderness.  No lateral joint line tenderness.  Stable knee.    The patient's sensation and motor function are intact in their lower extremities. Their distal pulses are 2+. The bilateral calves are soft and non-tender.       Vital signs in last 24 hours:    Imaging Review AP and lateral of the bilateral knees dated 09/12/2020 demonstrate the left knee has medial joint space narrowing but is not fully bone on bone. The lateral and patellofemoral look good. The right knee shows a Smith and Nephew prosthesis in good position. No periprosthetic abnormalities.  Procedure Documentation  Assessment/Plan:  End stage arthritis, right knee   The patient history, physical examination, clinical judgment of the provider and imaging studies are consistent with end stage degenerative joint disease of the right knee and total knee arthroplasty is deemed medically necessary. The treatment options including medical management, injection therapy arthroscopy and  arthroplasty were discussed at length. The risks and benefits of total knee arthroplasty were presented and reviewed. The risks due to aseptic loosening, infection, stiffness, patella tracking problems, thromboembolic complications and other imponderables were discussed. The patient acknowledged the explanation, agreed to proceed with the plan and consent was signed. Patient is being admitted for inpatient treatment for surgery, pain control, PT, OT, prophylactic antibiotics, VTE prophylaxis, progressive ambulation and ADLs and discharge planning. The patient is planning to be discharged home.   Patient's anticipated LOS is less than 2 midnights, meeting these requirements: - Lives within 1 hour of care - Has a competent adult at home to recover with post-op recover - NO history of  - Chronic pain requiring opioids  - Diabetes  - Coronary Artery Disease  - Heart failure  - Heart attack  - Stroke  - DVT/VTE  - Cardiac arrhythmia  - Respiratory Failure/COPD  - Renal failure  - Anemia  - Advanced Liver disease      Therapy Plans: [facility] Disposition: Home with wife Planned DVT Prophylaxis: Xarelto 10mg  (Hx of Colon Cancer) DME Needed: None PCP: Dr. Ronnald Ramp Jule Ser TXA: IV Allergies: None Anesthesia Concerns: None BMI: 26.2 Last HgbA1c: 7.4 -- five weeks ago  Pharmacy: [facility]  Other: Oxycodone causes N/V  On Fluoxetine and Zoloft.  - Patient was instructed on what medications to stop prior to surgery. - Follow-up visit in 2 weeks with Dr. Wynelle Link - Begin physical therapy following surgery - Pre-operative lab work as pre-surgical testing - Prescriptions will be provided in hospital at time of discharge  Fenton Foy, [facility], PA-C Orthopedic Surgery [facility] [place]

## 2020-11-27 NOTE — Brief Op Note (Signed)
11/27/2020  9:29 AM  PATIENT:  Despina Hick  75 y.o. male  PRE-OPERATIVE DIAGNOSIS:  arthrofibrosis right knee  POST-OPERATIVE DIAGNOSIS:  arthrofibrosis right knee  PROCEDURE:  Procedure(s) with comments: Right knee arthrotomy, scar excision, possibly polyethylene revision (Right) - 34min  SURGEON:  Surgeon(s) and Role:    Gaynelle Arabian, MD - Primary  PHYSICIAN ASSISTANT:   ASSISTANTS: Fenton Foy, PA-C   ANESTHESIA:   general and adductor canal block  EBL:  10 ml  BLOOD ADMINISTERED:none  DRAINS: none   LOCAL MEDICATIONS USED:  OTHER Exparel  COUNTS:  YES  TOURNIQUET:  25 minutes @ 300 mm HG  DICTATION: .Other Dictation: Dictation Number [id number]  PLAN OF CARE: Admit for overnight observation  PATIENT DISPOSITION:  PACU - hemodynamically stable.

## 2020-11-27 NOTE — Anesthesia Procedure Notes (Signed)
Anesthesia Regional Block: Adductor canal block   Pre-Anesthetic Checklist: ,, timeout performed, Correct Patient, Correct Site, Correct Laterality, Correct Procedure, Correct Position, site marked, Risks and benefits discussed,  Surgical consent,  Pre-op evaluation,  At surgeon's request and post-op pain management  Laterality: Right  Prep: chloraprep       Needles:  Injection technique: Single-shot  Needle Type: Echogenic Needle     Needle Length: 9cm  Needle Gauge: 21     Additional Needles:   Procedures:,,,, ultrasound used (permanent image in chart),,,,  Narrative:  Start time: 11/27/2020 8:00 AM End time: 11/27/2020 8:06 AM Injection made incrementally with aspirations every 5 mL.  Performed by: Personally  Anesthesiologist: Suzette Battiest, MD

## 2020-11-27 NOTE — Anesthesia Procedure Notes (Signed)
Procedure Name: LMA Insertion Date/Time: 11/27/2020 8:35 AM Performed by: Niel Hummer, CRNA Pre-anesthesia Checklist: Patient identified, Emergency Drugs available, Suction available and Patient being monitored Patient Re-evaluated:Patient Re-evaluated prior to induction Oxygen Delivery Method: Circle system utilized Preoxygenation: Pre-oxygenation with 100% oxygen Induction Type: IV induction LMA: LMA with gastric port inserted LMA Size: 4.0 Number of attempts: 1 Dental Injury: Teeth and Oropharynx as per pre-operative assessment

## 2020-11-27 NOTE — Interval H&P Note (Signed)
History and Physical Interval Note:  11/27/2020 8:15 AM  Leroy Jones  has presented today for surgery, with the diagnosis of arthrofibrosis right knee.  The various methods of treatment have been discussed with the patient and family. After consideration of risks, benefits and other options for treatment, the patient has consented to  Procedure(s) with comments: Right knee arthrotomy, scar excision, possibly polyethylene revision (Right) - 37min as a surgical intervention.  The patient's history has been reviewed, patient examined, no change in status, stable for surgery.  I have reviewed the patient's chart and labs.  Questions were answered to the patient's satisfaction.     Pilar Plate Leroy Jones

## 2020-11-27 NOTE — Progress Notes (Signed)
Inpatient Diabetes Program Recommendations  AACE/ADA: New Consensus Statement on Inpatient Glycemic Control (2015)  Target Ranges:  Prepandial:   less than 140 mg/dL      Peak postprandial:   less than 180 mg/dL (1-2 hours)      Critically ill patients:  140 - 180 mg/dL   Lab Results  Component Value Date   GLUCAP 236 (H) 11/27/2020   HGBA1C 7.7 (H) 11/18/2020    Review of Glycemic Control Results for Leroy Jones, Leroy Jones (MRN [id number]) as of 11/27/2020 11:23  Ref. Range 11/27/2020 06:50 11/27/2020 10:01  Glucose-Capillary Latest Ref Range: 70 - 99 mg/dL 242 (H) 236 (H)   Diabetes history: DM 2 Outpatient Diabetes medications:  Novolog 10-12 units qid, Lantus 10 units q HS, Metformin 1000 mg bid Current orders for Inpatient glycemic control:  Novolog moderate tid with meals  Inpatient Diabetes Program Recommendations:    Please add Lantus 10 units daily and Novolog 5 units tid with meals (hold if patient eats less than 50% or NPO).   Thanks, Adah Perl, RN, BC-ADM Inpatient Diabetes Coordinator Pager [phone] (8a-5p)

## 2020-11-28 DIAGNOSIS — M1711 Unilateral primary osteoarthritis, right knee: Secondary | ICD-10-CM | POA: Diagnosis not present

## 2020-11-28 LAB — CBC
HCT: 35.9 % — ABNORMAL LOW (ref 39.0–52.0)
Hemoglobin: 11.9 g/dL — ABNORMAL LOW (ref 13.0–17.0)
MCH: 30.6 pg (ref 26.0–34.0)
MCHC: 33.1 g/dL (ref 30.0–36.0)
MCV: 92.3 fL (ref 80.0–100.0)
Platelets: 224 10*3/uL (ref 150–400)
RBC: 3.89 MIL/uL — ABNORMAL LOW (ref 4.22–5.81)
RDW: 12.5 % (ref 11.5–15.5)
WBC: 11.8 10*3/uL — ABNORMAL HIGH (ref 4.0–10.5)
nRBC: 0 % (ref 0.0–0.2)

## 2020-11-28 LAB — BASIC METABOLIC PANEL
Anion gap: 10 (ref 5–15)
BUN: 20 mg/dL (ref 8–23)
CO2: 25 mmol/L (ref 22–32)
Calcium: 8.7 mg/dL — ABNORMAL LOW (ref 8.9–10.3)
Chloride: 102 mmol/L (ref 98–111)
Creatinine, Ser: 0.86 mg/dL (ref 0.61–1.24)
GFR, Estimated: >60 mL/min (ref >60)
Glucose, Bld: 245 mg/dL — ABNORMAL HIGH (ref 70–99)
Potassium: 4.3 mmol/L (ref 3.5–5.1)
Sodium: 137 mmol/L (ref 135–145)

## 2020-11-28 LAB — GLUCOSE, CAPILLARY: Glucose-Capillary: 356 mg/dL — ABNORMAL HIGH (ref 70–99)

## 2020-11-28 MED ORDER — RIVAROXABAN 10 MG PO TABS: 10.0000 mg | ORAL_TABLET | Freq: Every day | ORAL | 0 refills | Status: DC

## 2020-11-28 MED ORDER — ONDANSETRON HCL 4 MG PO TABS: 4.0000 mg | ORAL_TABLET | Freq: Every day | ORAL | 1 refills | Status: DC | PRN

## 2020-11-28 MED ORDER — METHOCARBAMOL 500 MG PO TABS: 500.0000 mg | ORAL_TABLET | Freq: Four times a day (QID) | ORAL | 0 refills | Status: DC | PRN

## 2020-11-28 MED ORDER — HYDROMORPHONE HCL 2 MG PO TABS: 2.0000 mg | ORAL_TABLET | Freq: Four times a day (QID) | ORAL | 0 refills | Status: AC | PRN

## 2020-11-28 NOTE — Progress Notes (Signed)
   Subjective: 1 Day Post-Op Procedure(s) (LRB): Right knee arthrotomy, scar excision, possibly polyethylene revision (Right) Patient reports pain as mild.   Patient seen in rounds by Dr. Wynelle Link. Patient is well, and has had no acute complaints or problems. No acute overnight events. Denies SOB, chest pain, or calf pain. Ambulated around 80 feet with PT yesterday.  We will continue therapy today.   Objective: Vital signs in last 24 hours: Temp:  [97.5 F (36.4 C)-99.5 F (37.5 C)] 98.5 F (36.9 C) (04/21 0532) Pulse Rate:  [58-88] 74 (04/21 0532) Resp:  [9-21] 18 (04/21 0532) BP: (126-152)/(68-85) 138/68 (04/21 0532) SpO2:  [94 %-100 %] 98 % (04/21 0532)  Intake/Output from previous day:  Intake/Output Summary (Last 24 hours) at 11/28/2020 0758 Last data filed at 11/28/2020 0634 Gross per 24 hour  Intake 3396.25 ml  Output 3325 ml  Net 71.25 ml     Intake/Output this shift: No intake/output data recorded.  Labs: Recent Labs    11/28/20 0316  HGB 11.9*   Recent Labs    11/28/20 0316  WBC 11.8*  RBC 3.89*  HCT 35.9*  PLT 224   Recent Labs    11/28/20 0316  NA 137  K 4.3  CL 102  CO2 25  BUN 20  CREATININE 0.86  GLUCOSE 245*  CALCIUM 8.7*   No results for input(s): LABPT, INR in the last 72 hours.  Exam: General - Patient is Alert and Oriented Extremity - Neurologically intact Neurovascular intact Sensation intact distally Dorsiflexion/Plantar flexion intact Dressing - dressing C/D/I Motor Function - intact, moving foot and toes well on exam.   Past Medical History:  Diagnosis Date  . Arthritis   . Diabetes mellitus without complication ([place])    type 2   . Peripheral neuropathy    arms and legs per pt   . PONV (postoperative nausea and vomiting)    with knee scope surgery   . Rectal cancer ([place]) 09/17/2011   colostomy     Assessment/Plan: 1 Day Post-Op Procedure(s) (LRB): Right knee arthrotomy, scar excision, possibly polyethylene  revision (Right) Principal Problem:   Chronic pain of right knee Active Problems:   Status post revision of total knee replacement, right  Estimated body mass index is 25.22 kg/m as calculated from the following:   Height as of this encounter: 5\' 7"  (1.702 m).   Weight as of this encounter: 73 kg. Advance diet Up with therapy   Patient's anticipated LOS is less than 2 midnights, meeting these requirements: - Lives within 1 hour of care - Has a competent adult at home to recover with post-op recover - NO history of  - Chronic pain requiring opioids  - Diabetes  - Coronary Artery Disease  - Heart failure  - Heart attack  - Stroke  - DVT/VTE  - Cardiac arrhythmia  - Respiratory Failure/COPD  - Renal failure  - Anemia  - Advanced Liver disease       DVT Prophylaxis - Xarelto Weight bearing as tolerated. Continue therapy.  Plan is to go Home after hospital stay.  Plan for one session of PT today, and if meeting goals, will plan for discharge this afternoon.   Patient to follow up in two weeks with Dr. Wynelle Link in clinic.   The PDMP database was reviewed today prior to any opioid medications being prescribed to this patient.    Fenton Foy, MBA, PA-C Orthopedic Surgery 11/28/2020, 7:58 AM

## 2020-11-28 NOTE — Progress Notes (Signed)
Physical Therapy Treatment Patient Details Name: Leroy Jones MRN: [id number] DOB: [birth year] Today's Date: 11/28/2020    History of Present Illness Patient is 75 y.o. Rt knee arthrotomy, scar excision and poly revision on 11/27/20. PMH significant for DM, OA, neuropathy, rectal cancer with colostomy.    PT Comments    POD 1 Right knee arthrotomy scar excision and polyethylene revision. Assisted with amb.  General Gait Details: cues for proximity to RW, pt steady with step through pattern, no overt LOB or buckling at Rt knee. progressed to min guard for safety. Then returned to room to perform some TE's following HEP handout.  Instructed on proper tech, freq as well as use of ICE.   Addressed all mobility questions, discussed appropriate activity, educated on use of ICE.  Pt ready for D/C to home.   Follow Up Recommendations  Follow surgeon's recommendation for DC plan and follow-up therapies;Outpatient PT     Equipment Recommendations  None recommended by PT    Recommendations for Other Services       Precautions / Restrictions Precautions Precautions: Fall Precaution Comments: no pillow under knee Restrictions Weight Bearing Restrictions: No RLE Weight Bearing: Weight bearing as tolerated    Mobility  Bed Mobility               General bed mobility comments: OOB in recliner    Transfers Overall transfer level: Needs assistance Equipment used: Rolling walker (2 wheeled) Transfers: Sit to/from [facility] Sit to Stand: Supervision Stand pivot transfers: Supervision       General transfer comment: cues for hand placement with power up, assist to initiate and complete rise to RW. pt steady once standing.  Ambulation/Gait Ambulation/Gait assistance: Supervision Gait Distance (Feet): 85 Feet Assistive device: Rolling walker (2 wheeled) Gait Pattern/deviations: Step-through pattern;Decreased weight shift to right Gait velocity: decr   General  Gait Details: cues for proximity to RW, pt steady with step through pattern, no overt LOB or buckling at Rt knee. progressed to min guard for safety.   Stairs Stairs: Yes Stairs assistance: Supervision Stair Management: No rails;Step to pattern;Forwards;With walker Number of Stairs: 1 General stair comments: one step twice with 25% VC's on proper tech and sequencing   Wheelchair Mobility    Modified Rankin (Stroke Patients Only)       Balance                                            Cognition Arousal/Alertness: Awake/alert Behavior During Therapy: WFL for tasks assessed/performed Overall Cognitive Status: Within Functional Limits for tasks assessed                                 General Comments: AxO x 3 eager to go home      Exercises   TE's following HEP handout 10 reps B LE ankle pumps 05 reps towel squeezes 05 reps knee presses 05 reps heel slides  05 reps SAQ's 05 reps SLR's 05 reps ABD Educated on use of gait belt to assist with TE's Followed by ICE     General Comments        Pertinent Vitals/Pain Pain Assessment: 0-10 Pain Score: 2  Pain Location: Rt knee Pain Descriptors / Indicators: Aching Pain Intervention(s): Monitored during session;Premedicated before session;Repositioned;Ice applied    Home Living  Prior Function            PT Goals (current goals can now be found in the care plan section) Progress towards PT goals: Progressing toward goals    Frequency    7X/week      PT Plan Current plan remains appropriate    Co-evaluation              AM-PAC PT "6 Clicks" Mobility   Outcome Measure  Help needed turning from your back to your side while in a flat bed without using bedrails?: A Little Help needed moving from lying on your back to sitting on the side of a flat bed without using bedrails?: A Little Help needed moving to and from a bed to a chair  (including a wheelchair)?: A Little Help needed standing up from a chair using your arms (e.g., wheelchair or bedside chair)?: A Little Help needed to walk in hospital room?: A Little Help needed climbing 3-5 steps with a railing? : A Little 6 Click Score: 18    End of Session Equipment Utilized During Treatment: Gait belt Activity Tolerance: Patient tolerated treatment well Patient left: in chair;with call bell/phone within reach;with chair alarm set Nurse Communication: Mobility status (pt ready for D/C to home) PT Visit Diagnosis: Muscle weakness (generalized) (M62.81);Difficulty in walking, not elsewhere classified (R26.2)     Time: 8657-8469 PT Time Calculation (min) (ACUTE ONLY): 28 min  Charges:  $Gait Training: 8-22 mins $Therapeutic Exercise: 8-22 mins                     Rica Koyanagi  PTA Acute  Rehabilitation Services Pager      [phone] Office      [phone]

## 2020-11-28 NOTE — Op Note (Signed)
NAMECALUB, TARNOW MEDICAL RECORD NO: [id number] ACCOUNT NO: [id number] DATE OF BIRTH: 09/06/1945 FACILITY: [facility] LOCATION: [id number] PHYSICIAN: Dione Plover. Cardelia Sassano, MD  Operative Report   DATE OF PROCEDURE: 11/27/2020  PREOPERATIVE DIAGNOSIS:  Arthrofibrosis, right knee, status post right total knee arthroplasty.  POSTOPERATIVE DIAGNOSIS:  Arthrofibrosis, right knee, status post right total knee arthroplasty.  PROCEDURE:  Right knee arthrotomy scar excision and polyethylene revision.  SURGEON:  Dione Plover. Allison Deshotels, MD.  ASSISTANTFenton Foy, PA-C  ANESTHESIA:  General and adductor canal block.  ESTIMATED BLOOD LOSS:  Minimal.  DRAINS:  None.  COMPLICATIONS:  None.  TOURNIQUET TIME:  25 minutes at 300 mmHg.  CONDITION:  Stable to recovery.  BRIEF CLINICAL NOTE: The patient is a 75 year old male who had a right total knee arthroplasty performed at the [facility] in [place] several years ago.  He has had significant stiffness in the knee with very limited range of motion, he presented to me in  second opinion 2 months ago and was noted to have range of motion about 10-70 degrees with a firm endpoint.  His components were all in good position and were well fixed.  This was really limiting his mobility and placing additional stress on his left  knee.  He presented for possible revision surgery.  We decided on an arthrotomy and scar excision and polyethylene revision in order to attempt to gain more range of motion, he presents now for that procedure.  DESCRIPTION OF PROCEDURE:  After successful administration of adductor canal block and general anesthetic, tourniquet was placed on his right thigh and his right lower extremity was prepped and draped in the usual sterile fashion.  The extremity was  wrapped an Esmarch, tourniquet inflated to 300 mmHg.  His exam under anesthesia shows range of motion about 15-65 degrees.  Midline incision is made going through his old scar over the right knee,  cut and through the skin to subcutaneous tissue.  There  is a fair amount of scarring subcutaneously and I released those adhesions to create subcutaneous flaps.  A fresh knife was used to make a medial parapatellar arthrotomy.  Tremendous amount of scarring present in the joint.  I excised these scar starting  underneath the medial aspect of the extensor mechanism to recreate the medial gutter.  I then elevated the soft tissue over the proximal medial tibia, took some scar out from the medial gutter there, it was a tremendous amount of scar present at the  joint line in front of the prosthesis.  This is all excised.  I then placed my attention laterally to remove scar from underneath the lateral part of the extensor mechanism recreating the lateral gutter, the infrapatellar scarring is also removed.  I  actually got to the point where I could evert the patella.  Patellar component was in good position as of the femoral and tibial components and they were all well fixed.  Given that he could not get full extension he was tight in both flexion and  extension I decided that it would be in his best interest to downsize the thickness of the polyethylene.  We removed the tibial polyethylene from the [facility] tibial tray, which was a size 56.  The polyethylene was 11 mm thick.  With a 10 mm  trial he had full extension, which was a big improvement.  We then placed the 10 mm thick, posterior stabilized high flex poly for a size 56.  It was impacted into the  tibial tray and locked in.  I checked for any more adhesions and the tissues appeared  to be free of adhesions.  I was able to flex the knee to about 115 degrees and the patella tracked normally.  We thoroughly irrigated the joint with a liter of saline solution with pulsatile lavage.  A 20 mL of Exparel mixed with 60 mL of saline was then  injected into the extensor mechanism, and subcutaneous tissues and periosteum of the femur.  We again irrigated and  then closed the arthrotomy with a running 0 Stratafix suture.  Flexion against gravity was 115 degrees and the patella tracked normally.   Tourniquet was released.  Total time of 25 minutes.  Subcutaneous bleeding stopped with electrocautery.  Subcutaneous was closed with interrupted 2-0 Vicryl, subcuticular running 4-0 Monocryl.  Incision was cleaned and dried and Steri-Strips and sterile  dressing applied.  He was placed into a knee immobilizer, awakened, and transported to recovery in stable condition.   PUS D: 11/27/2020 9:35:45 am T: 11/28/2020 3:06:00 am  JOB: [id number]/ [id number]

## 2020-11-29 ENCOUNTER — Encounter (HOSPITAL_COMMUNITY): Payer: Self-pay | Admitting: Orthopedic Surgery

## 2020-11-29 NOTE — Anesthesia Postprocedure Evaluation (Signed)
Anesthesia Post Note  Patient: Leroy Jones  Procedure(s) Performed: Right knee arthrotomy, scar excision, possibly polyethylene revision (Right Knee)     Patient location during evaluation: PACU Anesthesia Type: General Level of consciousness: awake and alert Pain management: pain level controlled Vital Signs Assessment: post-procedure vital signs reviewed and stable Respiratory status: spontaneous breathing, nonlabored ventilation, respiratory function stable and patient connected to nasal cannula oxygen Cardiovascular status: blood pressure returned to baseline and stable Postop Assessment: no apparent nausea or vomiting Anesthetic complications: no   No complications documented.  Last Vitals:  Vitals:   11/28/20 0532 11/28/20 0925  BP: 138/68 132/73  Pulse: 74 81  Resp: 18 18  Temp: 36.9 C   SpO2: 98% 99%    Last Pain:  Vitals:   11/28/20 0800  TempSrc:   PainSc: 0-No pain                 Tiajuana Amass

## 2020-12-04 NOTE — Discharge Summary (Signed)
Physician Discharge Summary   Patient ID: Leroy Jones MRN: [id number] DOB/AGE: August 20, 1945 75 y.o.  Admit date: 11/27/2020 Discharge date: 11/28/2020  Primary Diagnosis:  S/p Right Knee arthrotomy, scar excision, polyethylene revision  Admission Diagnoses:  Past Medical History:  Diagnosis Date  . Arthritis   . Diabetes mellitus without complication ([place])    type 2   . Peripheral neuropathy    arms and legs per pt   . PONV (postoperative nausea and vomiting)    with knee scope surgery   . Rectal cancer ([place]) 09/17/2011   colostomy    Discharge Diagnoses:   Principal Problem:   Chronic pain of right knee Active Problems:   Status post revision of total knee replacement, right  Estimated body mass index is 25.22 kg/m as calculated from the following:   Height as of this encounter: 5\' 7"  (1.702 m).   Weight as of this encounter: 73 kg.  Procedure:  Procedure(s) (LRB): Right knee arthrotomy, scar excision, possibly polyethylene revision (Right)   Consults: None  HPI: The patient is a 75 year old male who had a right total knee arthroplasty performed at the [facility] in [place] several years ago.  He has had significant stiffness in the knee with very limited range of motion, he presented to me in  second opinion 2 months ago and was noted to have range of motion about 10-70 degrees with a firm endpoint.  His components were all in good position and were well fixed.  This was really limiting his mobility and placing additional stress on his left  knee.  He presented for possible revision surgery.  We decided on an arthrotomy and scar excision and polyethylene revision in order to attempt to gain more range of motion, he presents now for that procedure.  Laboratory Data: Admission on 11/27/2020, Discharged on 11/28/2020  Component Date Value Ref Range Status  . ABO/RH(D) 11/27/2020    Final                   Value:A POS Performed at [facility], [place] [address] [place], [place] [address]   . Glucose-Capillary 11/27/2020 242* 70 - 99 mg/dL Final   Glucose reference range applies only to samples taken after fasting for at least 8 hours.  . Glucose-Capillary 11/27/2020 236* 70 - 99 mg/dL Final   Glucose reference range applies only to samples taken after fasting for at least 8 hours.  . WBC 11/28/2020 11.8* 4.0 - 10.5 K/uL Final  . RBC 11/28/2020 3.89* 4.22 - 5.81 MIL/uL Final  . Hemoglobin 11/28/2020 11.9* 13.0 - 17.0 g/dL Final  . HCT 11/28/2020 35.9* 39.0 - 52.0 % Final  . MCV 11/28/2020 92.3  80.0 - 100.0 fL Final  . MCH 11/28/2020 30.6  26.0 - 34.0 pg Final  . MCHC 11/28/2020 33.1  30.0 - 36.0 g/dL Final  . RDW 11/28/2020 12.5  11.5 - 15.5 % Final  . Platelets 11/28/2020 224  150 - 400 K/uL Final  . nRBC 11/28/2020 0.0  0.0 - 0.2 % Final   Performed at [facility], [place] [address] [place], [place] [address]  . Sodium 11/28/2020 137  135 - 145 mmol/L Final  . Potassium 11/28/2020 4.3  3.5 - 5.1 mmol/L Final  . Chloride 11/28/2020 102  98 - 111 mmol/L Final  . CO2 11/28/2020 25  22 - 32 mmol/L Final  . Glucose, Bld 11/28/2020 245* 70 - 99 mg/dL Final   Glucose reference range  applies only to samples taken after fasting for at least 8 hours.  . BUN 11/28/2020 20  8 - 23 mg/dL Final  . Creatinine, Ser 11/28/2020 0.86  0.61 - 1.24 mg/dL Final  . Calcium 11/28/2020 8.7* 8.9 - 10.3 mg/dL Final  . GFR, Estimated 11/28/2020 >60  >60 mL/min Final   Comment: (NOTE) Calculated using the CKD-EPI Creatinine Equation (2021)   . Anion gap 11/28/2020 10  5 - 15 Final   Performed at [facility], [place] [address], [place], [place] [address]  . Glucose-Capillary 11/27/2020 361* 70 - 99 mg/dL Final   Glucose reference range applies only to samples taken after fasting for at least 8 hours.  . Glucose-Capillary 11/27/2020 326* 70 - 99 mg/dL Final   Glucose reference range applies only to samples taken after fasting for at  least 8 hours.  . Glucose-Capillary 11/28/2020 356* 70 - 99 mg/dL Final   Glucose reference range applies only to samples taken after fasting for at least 8 hours.  Hospital Outpatient Visit on 11/25/2020  Component Date Value Ref Range Status  . SARS Coronavirus 2 11/25/2020 NEGATIVE  NEGATIVE Final   Comment: (NOTE) SARS-CoV-2 target nucleic acids are NOT DETECTED.  The SARS-CoV-2 RNA is generally detectable in upper and lower respiratory specimens during the acute phase of infection. Negative results do not preclude SARS-CoV-2 infection, do not rule out co-infections with other pathogens, and should not be used as the sole basis for treatment or other patient management decisions. Negative results must be combined with clinical observations, patient history, and epidemiological information. The expected result is Negative.  Fact Sheet for Patients: SugarRoll.be  Fact Sheet for Healthcare Providers: https://www.woods-mathews.com/  This test is not yet approved or cleared by the [place] FDA and  has been authorized for detection and/or diagnosis of SARS-CoV-2 by FDA under an Emergency Use Authorization (EUA). This EUA will remain  in effect (meaning this test can be used) for the duration of the COVID-19 declaration under Se                          ction 564(b)(1) of the Act, 21 U.S.C. section 360bbb-3(b)(1), unless the authorization is terminated or revoked sooner.  Performed at [facility], [place] [address] [place], [place] [address]   Hospital Outpatient Visit on 11/18/2020  Component Date Value Ref Range Status  . MRSA, PCR 11/18/2020 NEGATIVE  NEGATIVE Final  . Staphylococcus aureus 11/18/2020 NEGATIVE  NEGATIVE Final   Comment: (NOTE) The Xpert SA Assay (FDA approved for NASAL specimens in patients 66 years of age and older), is one component of a comprehensive surveillance program. It is not intended to diagnose  infection nor to guide or monitor treatment. Performed at [facility], [place] [address], [place], [place] [address]   . WBC 11/18/2020 7.9  4.0 - 10.5 K/uL Final  . RBC 11/18/2020 4.74  4.22 - 5.81 MIL/uL Final  . Hemoglobin 11/18/2020 14.4  13.0 - 17.0 g/dL Final  . HCT 11/18/2020 45.3  39.0 - 52.0 % Final  . MCV 11/18/2020 95.6  80.0 - 100.0 fL Final  . MCH 11/18/2020 30.4  26.0 - 34.0 pg Final  . MCHC 11/18/2020 31.8  30.0 - 36.0 g/dL Final  . RDW 11/18/2020 13.1  11.5 - 15.5 % Final  . Platelets 11/18/2020 271  150 - 400 K/uL Final  . nRBC 11/18/2020 0.0  0.0 - 0.2 % Final  Performed at [facility], [place] [address], [place], [place] [address]  . Sodium 11/18/2020 141  135 - 145 mmol/L Final  . Potassium 11/18/2020 4.8  3.5 - 5.1 mmol/L Final  . Chloride 11/18/2020 108  98 - 111 mmol/L Final  . CO2 11/18/2020 27  22 - 32 mmol/L Final  . Glucose, Bld 11/18/2020 269* 70 - 99 mg/dL Final   Glucose reference range applies only to samples taken after fasting for at least 8 hours.  . BUN 11/18/2020 19  8 - 23 mg/dL Final  . Creatinine, Ser 11/18/2020 0.75  0.61 - 1.24 mg/dL Final  . Calcium 11/18/2020 9.2  8.9 - 10.3 mg/dL Final  . Total Protein 11/18/2020 7.1  6.5 - 8.1 g/dL Final  . Albumin 11/18/2020 4.1  3.5 - 5.0 g/dL Final  . AST 11/18/2020 20  15 - 41 U/L Final  . ALT 11/18/2020 17  0 - 44 U/L Final  . Alkaline Phosphatase 11/18/2020 46  38 - 126 U/L Final  . Total Bilirubin 11/18/2020 0.8  0.3 - 1.2 mg/dL Final  . GFR, Estimated 11/18/2020 >60  >60 mL/min Final   Comment: (NOTE) Calculated using the CKD-EPI Creatinine Equation (2021)   . Anion gap 11/18/2020 6  5 - 15 Final   Performed at [facility], [place] [address] [place], [place] [address]  . Prothrombin Time 11/18/2020 12.8  11.4 - 15.2 seconds Final  . INR 11/18/2020 1.0  0.8 - 1.2 Final   Comment: (NOTE) INR goal varies based on device and disease  states. Performed at [facility], [place] [address], [place], [place] [address]   . aPTT 11/18/2020 31  24 - 36 seconds Final   Performed at [facility], [place] [address], [place], [place] [address]  . ABO/RH(D) 11/18/2020 A POS   Final  . Antibody Screen 11/18/2020 NEG   Final  . Sample Expiration 11/18/2020 11/30/2020,2359   Final  . Extend sample reason 11/18/2020    Final                   Value:NO TRANSFUSIONS OR PREGNANCY IN THE PAST 3 MONTHS Performed at [facility], [place] [address], [place], [place] [address]   . Hgb A1c MFr Bld 11/18/2020 7.7* 4.8 - 5.6 % Final   Comment: (NOTE) Pre diabetes:          5.7%-6.4%  Diabetes:              >6.4%  Glycemic control for   <7.0% adults with diabetes   . Mean Plasma Glucose 11/18/2020 174.29  mg/dL Final   Performed at [facility] [address] [place], [place] [address]     X-Rays:No results found.  EKG: Orders placed or performed in visit on 03/07/20  . EKG 12-Lead     Hospital Course: Leroy Jones is a 75 y.o. who was admitted to [facility]. They were brought to the operating room on 11/27/2020 and underwent Procedure(s): Right knee arthrotomy, scar excision, possibly polyethylene revision.  Patient tolerated the procedure well and was later transferred to the recovery room and then to the orthopaedic floor for postoperative care. They were given PO and IV analgesics for pain control following their surgery. They were given 24 hours of postoperative antibiotics of  Anti-infectives (From admission, onward)   Start     Dose/Rate Route Frequency Ordered Stop   11/27/20 1500  ceFAZolin (  ANCEF) IVPB 2g/100 mL premix        2 g 200 mL/hr over 30 Minutes Intravenous Every 6 hours 11/27/20 1105 11/28/20 0723   11/27/20 0630  ceFAZolin (ANCEF) IVPB 2g/100 mL premix        2 g 200 mL/hr over 30 Minutes Intravenous On call to O.R. 11/27/20 [id number] 11/27/20 0906      and started on DVT prophylaxis in the form of Xarelto and TED hose.   PT and OT were ordered for total joint protocol. Discharge planning consulted to help with postop disposition and equipment needs.  Patient had an uneventful night on the evening of surgery. They started to get up OOB with therapy on 11/27/20. Pt was seen during rounds and was ready to go home pending progress with therapy. He worked with therapy on POD #1 and was meeting goals. Pt was discharged to home later that day in stable condition.  Diet: Regular diet Activity: WBAT Follow-up: in two weeks Disposition: Home Discharged Condition: good   Discharge Instructions    Call MD / Call 911   Complete by: As directed    If you experience chest pain or shortness of breath, CALL 911 and be transported to the hospital emergency room.  If you develope a fever above 101 F, pus (white drainage) or increased drainage or redness at the wound, or calf pain, call your surgeon's office.   Change dressing   Complete by: As directed    You may remove the bulky bandage (ACE wrap and gauze) two days after surgery. You will have an adhesive waterproof bandage underneath. Leave this in place until your first follow-up appointment.   Constipation Prevention   Complete by: As directed    Drink plenty of fluids.  Prune juice may be helpful.  You may use a stool softener, such as Colace (over the counter) 100 mg twice a day.  Use MiraLax (over the counter) for constipation as needed.   Diet - low sodium heart healthy   Complete by: As directed    Do not put a pillow under the knee. Place it under the heel.   Complete by: As directed    Driving restrictions   Complete by: As directed    No driving for two weeks   Post-operative opioid taper instructions:   Complete by: As directed    POST-OPERATIVE OPIOID TAPER INSTRUCTIONS: It is important to wean off of your opioid medication as soon as possible. If you do not need pain medication after  your surgery it is ok to stop day one. Opioids include: Codeine, Hydrocodone(Norco, Vicodin), Oxycodone(Percocet, oxycontin) and hydromorphone amongst others.  Long term and even short term use of opiods can cause: Increased pain response Dependence Constipation Depression Respiratory depression And more.  Withdrawal symptoms can include Flu like symptoms Nausea, vomiting And more Techniques to manage these symptoms Hydrate well Eat regular healthy meals Stay active Use relaxation techniques(deep breathing, meditating, yoga) Do Not substitute Alcohol to help with tapering If you have been on opioids for less than two weeks and do not have pain than it is ok to stop all together.  Plan to wean off of opioids This plan should start within one week post op of your joint replacement. Maintain the same interval or time between taking each dose and first decrease the dose.  Cut the total daily intake of opioids by one tablet each day Next start to increase the time between doses. The last dose that should be  eliminated is the evening dose.      TED hose   Complete by: As directed    Use stockings (TED hose) for three weeks on both leg(s).  You may remove them at night for sleeping.   Weight bearing as tolerated   Complete by: As directed      Allergies as of 11/28/2020      Reactions   Codeine Nausea And Vomiting   Oxycodone Hcl Nausea And Vomiting      Medication List    STOP taking these medications   acetaminophen 325 MG tablet Commonly known as: TYLENOL   ascorbic acid 500 MG tablet Commonly known as: VITAMIN C   cyanocobalamin 1000 MCG/ML injection Commonly known as: (VITAMIN B-12)   ferrous sulfate 325 (65 FE) MG tablet   FLUoxetine 10 MG capsule Commonly known as: PROZAC   Magnesium Oxide 420 (252 Mg) MG Tabs   meloxicam 15 MG tablet Commonly known as: MOBIC   vitamin B-12 500 MCG tablet Commonly known as: CYANOCOBALAMIN     TAKE these medications    atorvastatin 40 MG tablet Commonly known as: LIPITOR Take 40 mg by mouth at bedtime.   empagliflozin 25 MG Tabs tablet Commonly known as: JARDIANCE Take 12.5 mg by mouth daily.   gabapentin 300 MG capsule Commonly known as: NEURONTIN Take 300 mg by mouth 3 (three) times daily.   glucose 4 GM chewable tablet Chew 4 tablets by mouth as needed for low blood sugar (Repeat every 15 minutes if blood sugar less than 70).   insulin glargine 100 UNIT/ML injection Commonly known as: LANTUS Inject 10 Units into the skin at bedtime.   meclizine 25 MG tablet Commonly known as: ANTIVERT Take 25 mg by mouth 3 (three) times daily as needed for dizziness.   metFORMIN 500 MG tablet Commonly known as: GLUCOPHAGE Take 1,000 mg by mouth 2 (two) times daily with a meal.   methocarbamol 500 MG tablet Commonly known as: ROBAXIN Take 1 tablet (500 mg total) by mouth every 6 (six) hours as needed for muscle spasms.   NovoLOG FlexPen 100 UNIT/ML FlexPen Generic drug: insulin aspart Inject 10-12 Units into the skin 4 (four) times daily -  with meals and at bedtime. Sliding scale- 0-80=0, 80-100=5, 101-140=6, 141-180=7, 181-220=8, 221-260=9, 261-320=10, 321-340=11, over = 12 units per pt at preop appt   ondansetron 4 MG tablet Commonly known as: Zofran Take 1 tablet (4 mg total) by mouth daily as needed for nausea or vomiting.   rivaroxaban 10 MG Tabs tablet Commonly known as: XARELTO Take 1 tablet (10 mg total) by mouth daily. Then take one 81 mg aspirin once a day for three weeks. Then discontinue aspirin.   sertraline 100 MG tablet Commonly known as: ZOLOFT Take 100 mg by mouth daily.     ASK your doctor about these medications   HYDROmorphone 2 MG tablet Commonly known as: Dilaudid Take 1-2 tablets (2-4 mg total) by mouth every 6 (six) hours as needed for up to 5 days for severe pain. Ask about: Should I take this medication?            Discharge Care Instructions  (From  admission, onward)         Start     Ordered   11/28/20 0000  Weight bearing as tolerated        11/28/20 0802   11/28/20 0000  Change dressing       Comments: You may remove the bulky bandage (ACE  wrap and gauze) two days after surgery. You will have an adhesive waterproof bandage underneath. Leave this in place until your first follow-up appointment.   11/28/20 0802          Follow-up Information    Gaynelle Arabian, MD. Schedule an appointment as soon as possible for a visit on 12/10/2020.   Specialty: Orthopedic Surgery Contact information: [address] [place] [address] [phone]               Signed: Fenton Foy, MBA, PA-C Orthopedic Surgery 12/04/2020, 2:30 PM

## 2021-01-03 NOTE — Progress Notes (Addendum)
COVID Vaccine Completed: Yes Date COVID Vaccine completed: x2 Has received booster: Yes COVID vaccine manufacturer:   Moderna    Date of COVID positive in last 90 days: No  PCP - Dr. Sharlynn Oliphant [facility] Cardiologist - Dr. Eleonore Chiquito last office visit note 03/07/20 in epic  Chest x-ray - greater than 1 year EKG - 03/08/20 in epic Stress Test - greater than 2 years ECHO - greater than 2 years Cardiac Cath - N/A Pacemaker/ICD device last checked:N/A  Sleep Study - N/A CPAP - N/A  Fasting Blood Sugar - 80-240 Checks Blood Sugar __continous glucose meter___ times a day  Blood Thinner Instructions: N/A Aspirin Instructions:  Last Dose:01/11/2021  Activity level: Able to exercise without symptoms   Anesthesia review: Diabetic  Patient denies shortness of breath, fever, cough and chest pain at PAT appointment   Patient verbalized understanding of instructions that were given to them at the PAT appointment. Patient was also instructed that they will need to review over the PAT instructions again at home before surgery.

## 2021-01-08 ENCOUNTER — Encounter (HOSPITAL_COMMUNITY): Payer: Self-pay | Admitting: Orthopedic Surgery

## 2021-01-08 ENCOUNTER — Other Ambulatory Visit: Payer: Self-pay

## 2021-01-13 ENCOUNTER — Ambulatory Visit (HOSPITAL_COMMUNITY): Payer: No Typology Code available for payment source | Admitting: Physician Assistant

## 2021-01-13 ENCOUNTER — Encounter (HOSPITAL_COMMUNITY): Payer: Self-pay | Admitting: Orthopedic Surgery

## 2021-01-13 ENCOUNTER — Ambulatory Visit (HOSPITAL_COMMUNITY)
Admission: RE | Admit: 2021-01-13 | Discharge: 2021-01-13 | Disposition: A | Discharge status: Home / Self Care | Payer: No Typology Code available for payment source | Attending: Orthopedic Surgery | Admitting: Orthopedic Surgery

## 2021-01-13 ENCOUNTER — Encounter (HOSPITAL_COMMUNITY)
Admission: RE | Disposition: A | Discharge status: Home / Self Care | Payer: Self-pay | Source: Home / Self Care | Attending: Orthopedic Surgery

## 2021-01-13 DIAGNOSIS — Z87891 Personal history of nicotine dependence: Secondary | ICD-10-CM | POA: Insufficient documentation

## 2021-01-13 DIAGNOSIS — Z85048 Personal history of other malignant neoplasm of rectum, rectosigmoid junction, and anus: Secondary | ICD-10-CM | POA: Diagnosis not present

## 2021-01-13 DIAGNOSIS — Z794 Long term (current) use of insulin: Secondary | ICD-10-CM | POA: Diagnosis not present

## 2021-01-13 DIAGNOSIS — Z79899 Other long term (current) drug therapy: Secondary | ICD-10-CM | POA: Diagnosis not present

## 2021-01-13 DIAGNOSIS — Z9889 Other specified postprocedural states: Secondary | ICD-10-CM | POA: Diagnosis not present

## 2021-01-13 DIAGNOSIS — Z7982 Long term (current) use of aspirin: Secondary | ICD-10-CM | POA: Insufficient documentation

## 2021-01-13 DIAGNOSIS — Z7984 Long term (current) use of oral hypoglycemic drugs: Secondary | ICD-10-CM | POA: Diagnosis not present

## 2021-01-13 DIAGNOSIS — M24661 Ankylosis, right knee: Secondary | ICD-10-CM | POA: Insufficient documentation

## 2021-01-13 DIAGNOSIS — Z933 Colostomy status: Secondary | ICD-10-CM | POA: Diagnosis not present

## 2021-01-13 HISTORY — PX: KNEE CLOSED REDUCTION: SHX995

## 2021-01-13 HISTORY — DX: Unspecified hearing loss, unspecified ear: H91.90

## 2021-01-13 LAB — BASIC METABOLIC PANEL
Anion gap: 12 (ref 5–15)
BUN: 18 mg/dL (ref 8–23)
CO2: 24 mmol/L (ref 22–32)
Calcium: 9.2 mg/dL (ref 8.9–10.3)
Chloride: 102 mmol/L (ref 98–111)
Creatinine, Ser: 0.73 mg/dL (ref 0.61–1.24)
GFR, Estimated: >60 mL/min (ref >60)
Glucose, Bld: 297 mg/dL — ABNORMAL HIGH (ref 70–99)
Potassium: 4.2 mmol/L (ref 3.5–5.1)
Sodium: 138 mmol/L (ref 135–145)

## 2021-01-13 LAB — GLUCOSE, CAPILLARY
Glucose-Capillary: 267 mg/dL — ABNORMAL HIGH (ref 70–99)
Glucose-Capillary: 227 mg/dL — ABNORMAL HIGH (ref 70–99)

## 2021-01-13 LAB — CBC
HCT: 42.4 % (ref 39.0–52.0)
Hemoglobin: 13.3 g/dL (ref 13.0–17.0)
MCH: 30 pg (ref 26.0–34.0)
MCHC: 31.4 g/dL (ref 30.0–36.0)
MCV: 95.5 fL (ref 80.0–100.0)
Platelets: 266 10*3/uL (ref 150–400)
RBC: 4.44 MIL/uL (ref 4.22–5.81)
RDW: 13.4 % (ref 11.5–15.5)
WBC: 8.8 10*3/uL (ref 4.0–10.5)
nRBC: 0 % (ref 0.0–0.2)

## 2021-01-13 SURGERY — MANIPULATION, KNEE, CLOSED
Anesthesia: General | Site: Knee | Laterality: Right

## 2021-01-13 MED ORDER — HYDROMORPHONE HCL 2 MG PO TABS: 2.0000 mg | ORAL_TABLET | Freq: Four times a day (QID) | ORAL | 0 refills | Status: AC | PRN

## 2021-01-13 MED ORDER — LACTATED RINGERS IV SOLN: INTRAVENOUS | Status: DC

## 2021-01-13 MED ORDER — ORAL CARE MOUTH RINSE: 15.0000 mL | Freq: Once | OROMUCOSAL | Status: AC

## 2021-01-13 MED ORDER — CHLORHEXIDINE GLUCONATE 0.12 % MT SOLN
15.0000 mL | Freq: Once | OROMUCOSAL | Status: AC
  Administered 2021-01-13: 15 mL via OROMUCOSAL

## 2021-01-13 MED ORDER — CHLORHEXIDINE GLUCONATE 4 % EX LIQD: 60.0000 mL | Freq: Once | CUTANEOUS | Status: DC

## 2021-01-13 MED ORDER — FENTANYL CITRATE (PF) 100 MCG/2ML IJ SOLN
INTRAMUSCULAR | Status: AC
  Filled 2021-01-13: qty 2

## 2021-01-13 MED ORDER — FENTANYL CITRATE (PF) 100 MCG/2ML IJ SOLN
INTRAMUSCULAR | Status: DC | PRN
  Administered 2021-01-13 (×2): 50 ug via INTRAVENOUS

## 2021-01-13 MED ORDER — AMISULPRIDE (ANTIEMETIC) 5 MG/2ML IV SOLN: 10.0000 mg | Freq: Once | INTRAVENOUS | Status: DC | PRN

## 2021-01-13 MED ORDER — ACETAMINOPHEN 10 MG/ML IV SOLN
1000.0000 mg | Freq: Once | INTRAVENOUS | Status: DC
  Filled 2021-01-13: qty 100

## 2021-01-13 MED ORDER — PROMETHAZINE HCL 25 MG/ML IJ SOLN: 6.2500 mg | INTRAMUSCULAR | Status: DC | PRN

## 2021-01-13 MED ORDER — LIDOCAINE HCL (CARDIAC) PF 100 MG/5ML IV SOSY
PREFILLED_SYRINGE | INTRAVENOUS | Status: DC | PRN
  Administered 2021-01-13: 60 mg via INTRATRACHEAL

## 2021-01-13 MED ORDER — PROPOFOL 10 MG/ML IV BOLUS
INTRAVENOUS | Status: DC | PRN
  Administered 2021-01-13: 150 mg via INTRAVENOUS

## 2021-01-13 MED ORDER — LIDOCAINE HCL (PF) 2 % IJ SOLN
INTRAMUSCULAR | Status: AC
  Filled 2021-01-13: qty 5

## 2021-01-13 MED ORDER — PROPOFOL 10 MG/ML IV BOLUS
INTRAVENOUS | Status: AC
  Filled 2021-01-13: qty 20

## 2021-01-13 MED ORDER — POVIDONE-IODINE 10 % EX SWAB
2.0000 "application " | Freq: Once | CUTANEOUS | Status: AC
  Administered 2021-01-13: 2 via TOPICAL

## 2021-01-13 MED ORDER — METHOCARBAMOL 500 MG PO TABS: 500.0000 mg | ORAL_TABLET | Freq: Four times a day (QID) | ORAL | 0 refills | Status: AC | PRN

## 2021-01-13 MED ORDER — FENTANYL CITRATE (PF) 100 MCG/2ML IJ SOLN: 25.0000 ug | INTRAMUSCULAR | Status: DC | PRN

## 2021-01-13 SURGICAL SUPPLY — 18 items
BNDG ADH 1X3 SHEER STRL LF (GAUZE/BANDAGES/DRESSINGS) IMPLANT
BNDG ADH THN 3X1 STRL LF (GAUZE/BANDAGES/DRESSINGS)
COVER SURGICAL LIGHT HANDLE (MISCELLANEOUS) ×1 IMPLANT
COVER WAND RF STERILE (DRAPES) IMPLANT
GAUZE SPONGE 4X4 12PLY STRL (GAUZE/BANDAGES/DRESSINGS) IMPLANT
GLOVE SRG 8 PF TXTR STRL LF DI (GLOVE) ×1 IMPLANT
GLOVE SURG ENC MOIS LTX SZ6.5 (GLOVE) ×2 IMPLANT
GLOVE SURG ENC MOIS LTX SZ8 (GLOVE) ×2 IMPLANT
GLOVE SURG POLYISO LF SZ7 (GLOVE) ×1 IMPLANT
GLOVE SURG UNDER POLY LF SZ7 (GLOVE) ×2 IMPLANT
GLOVE SURG UNDER POLY LF SZ8 (GLOVE)
GOWN STRL REUS W/TWL LRG LVL3 (GOWN DISPOSABLE) ×1 IMPLANT
KIT TURNOVER KIT A (KITS) ×1 IMPLANT
NDL SAFETY ECLIPSE 18X1.5 (NEEDLE) IMPLANT
NEEDLE HYPO 18GX1.5 SHARP (NEEDLE)
PENCIL SMOKE EVACUATOR (MISCELLANEOUS) IMPLANT
SWABSTICK PVP IODINE (MISCELLANEOUS) ×1 IMPLANT
SYR CONTROL 10ML LL (SYRINGE) IMPLANT

## 2021-01-13 NOTE — Anesthesia Postprocedure Evaluation (Signed)
Anesthesia Post Note  Patient: Leroy Jones  Procedure(s) Performed: CLOSED MANIPULATION KNEE (Right Knee)     Patient location during evaluation: PACU Anesthesia Type: General Level of consciousness: awake and alert Pain management: pain level controlled Vital Signs Assessment: post-procedure vital signs reviewed and stable Respiratory status: spontaneous breathing, nonlabored ventilation, respiratory function stable and patient connected to nasal cannula oxygen Cardiovascular status: blood pressure returned to baseline and stable Postop Assessment: no apparent nausea or vomiting Anesthetic complications: no   No complications documented.  Last Vitals:  Vitals:   01/13/21 1430 01/13/21 1445  BP: (!) 154/76 138/77  Pulse: 72 72  Resp: 17 17  Temp:    SpO2: 98% 99%    Last Pain:  Vitals:   01/13/21 1445  TempSrc:   PainSc: 1                  Aanika Defoor DANIEL

## 2021-01-13 NOTE — H&P (Signed)
CC- Leroy Jones is a 75 y.o. male who presents with right knee stiffness.  HPI- . Knee Pain: Patient presents with stiffness involving the  right knee. Onset of the symptoms was about a month ago. Inciting event: He had a right knee arthrotomy and scar excision on 11/27/20 and did not gain the flexion that was anticipated with physical therapy. He has not rogressed beyond 60 degrees of flexion and presents now for closed manipulation right knee.   Past Medical History:  Diagnosis Date  . Arthritis   . Diabetes mellitus without complication ([place])    type 2   . Hard of hearing   . Peripheral neuropathy    arms and legs per pt   . PONV (postoperative nausea and vomiting)    with knee scope surgery   . Rectal cancer ([place]) 09/17/2011   colostomy     Past Surgical History:  Procedure Laterality Date  . bilateral cataract surgery     . CHOLECYSTECTOMY    . colon cancer    . COLOSTOMY    . KNEE ARTHROTOMY Right 11/27/2020   Procedure: Right knee arthrotomy, scar excision, possibly polyethylene revision;  Surgeon: Gaynelle Arabian, MD;  Location: [facility];  Service: Orthopedics;  Laterality: Right;  17min  . KNEE SURGERY      Prior to Admission medications   Medication Sig Start Date End Date Taking? Authorizing Provider  ascorbic acid (VITAMIN C) 500 MG tablet Take 500 mg by mouth daily. 04/30/20  Yes [provider]  aspirin EC 81 MG tablet Take 81 mg by mouth daily. Swallow whole.   Yes [provider]  empagliflozin (JARDIANCE) 25 MG TABS tablet Take 12.5 mg by mouth daily.   Yes [provider]  ferrous sulfate 325 (65 FE) MG tablet Take 325 mg by mouth every Tuesday, Thursday, and Saturday at 6 PM.   Yes [provider]  FLUoxetine (PROZAC) 10 MG capsule Take 10 mg by mouth daily. 04/30/20  Yes [provider]  gabapentin (NEURONTIN) 300 MG capsule Take 300 mg by mouth 3 (three) times daily.   Yes [provider]  glucose 4 GM chewable  tablet Chew 4 tablets by mouth as needed for low blood sugar (Repeat every 15 minutes if blood sugar less than 70).   Yes [provider]  HYDROmorphone (DILAUDID) 2 MG tablet Take 1-2 tablets (2-4 mg total) by mouth every 6 (six) hours as needed for up to 5 days for severe pain. 01/13/21 01/18/21 Yes Edmisten, Kristie L, PA  insulin aspart (NOVOLOG FLEXPEN) 100 UNIT/ML FlexPen Inject 10-12 Units into the skin 4 (four) times daily -  with meals and at bedtime. Sliding scale- 0-80=0, 80-100=5, 101-140=6, 141-180=7, 181-220=8, 221-260=9, 261-320=10, 321-340=11, over = 12 units per pt at preop appt   Yes [provider]  insulin glargine (LANTUS) 100 UNIT/ML injection Inject 10 Units into the skin at bedtime.   Yes [provider]  MAGNESIUM PO Take 420 mg by mouth daily.   Yes [provider]  metFORMIN (GLUCOPHAGE) 500 MG tablet Take 1,000 mg by mouth 2 (two) times daily with a meal.   Yes [provider]  methocarbamol (ROBAXIN) 500 MG tablet Take 1 tablet (500 mg total) by mouth every 6 (six) hours as needed for muscle spasms. 01/13/21  Yes Edmisten, Kristie L, PA  rosuvastatin (CRESTOR) 40 MG tablet Take 40 mg by mouth daily.   Yes [provider]  vitamin B-12 (CYANOCOBALAMIN) 500 MCG tablet  Take 1,000 mcg by mouth daily.   Yes [provider]  methocarbamol (ROBAXIN) 500 MG tablet Take 1 tablet (500 mg total) by mouth every 6 (six) hours as needed for muscle spasms. Patient not taking: Reported on 01/03/2021 11/28/20   Fenton Foy D, PA-C  ondansetron (ZOFRAN) 4 MG tablet Take 1 tablet (4 mg total) by mouth daily as needed for nausea or vomiting. Patient not taking: Reported on 01/03/2021 11/28/20 11/28/21  Fenton Foy D, PA-C  rivaroxaban (XARELTO) 10 MG TABS tablet Take 1 tablet (10 mg total) by mouth daily. Then take one 81 mg aspirin once a day for three weeks. Then discontinue aspirin. Patient not taking: Reported on 01/03/2021  11/28/20   Fenton Foy D, PA-C   Right knee exam No warmth or effusion, antalgic gait, Rage 5-60 degrees  Physical Examination: General appearance - alert, well appearing, and in no distress Mental status - alert, oriented to person, place, and time Chest - clear to auscultation, no wheezes, rales or rhonchi, symmetric air entry Heart - normal rate, regular rhythm, normal S1, S2, no murmurs, rubs, clicks or gallops Abdomen - soft, nontender, nondistended, no masses or organomegaly Neurological - alert, oriented, normal speech, no focal findings or movement disorder noted   Asessment/Plan--- Right kneearthrofibrosis- - Plan rightt knee closed manipulation. Procedure risks and potential comps discussed with patient who elects to proceed. Goals are decreased pain and increased function with a high likelihood of achieving both

## 2021-01-13 NOTE — Op Note (Signed)
  OPERATIVE REPORT   PREOPERATIVE DIAGNOSIS: Arthrofibrosis, Right  knee.   POSTOPERATIVE DIAGNOSIS: Arthrofibrosis, Right knee.   PROCEDURE:  Right  knee closed manipulation.   SURGEON: Gaynelle Arabian, MD   ASSISTANT: None.   ANESTHESIA: General.   COMPLICATIONS: None.   CONDITION: Stable to Recovery.   Pre-manipulation range of motion is 5-60.  Post-manipulation range of  Motion is 0-110  PROCEDURE IN DETAIL: After successful administration of general  anesthetic, exam under anesthesia was performed showing range of motion  5-60 degrees. I then placed my chest against the proximal tibia,  flexing the knee with audible lysis of adhesions. I was easily able to  get the knee flexed to 110  degrees. I then put the knee back in extension and with some patellar manipulation and gentle pressure got within 5 degrees of full  Extension.The patient was subsequently awakened and transported to Recovery in  stable condition.

## 2021-01-13 NOTE — Anesthesia Preprocedure Evaluation (Addendum)
Anesthesia Evaluation  Patient identified by MRN, date of birth, ID band Patient awake    Reviewed: Allergy & Precautions, NPO status , Patient's Chart, lab work & pertinent test results  History of Anesthesia Complications (+) PONV and history of anesthetic complications  Airway Mallampati: I  TM Distance: >3 FB Neck ROM: Full    Dental  (+) Edentulous Upper, Edentulous Lower, Dental Advisory Given   Pulmonary former smoker,    Pulmonary exam normal        Cardiovascular negative cardio ROS Normal cardiovascular exam     Neuro/Psych  Neuromuscular disease    GI/Hepatic negative GI ROS, Neg liver ROS,   Endo/Other  diabetes, Type 2  Renal/GU negative Renal ROS     Musculoskeletal  (+) Arthritis ,   Abdominal   Peds  Hematology negative hematology ROS (+)   Anesthesia Other Findings   Reproductive/Obstetrics                            Lab Results  Component Value Date   WBC 11.8 (H) 11/28/2020   HGB 11.9 (L) 11/28/2020   HCT 35.9 (L) 11/28/2020   MCV 92.3 11/28/2020   PLT 224 11/28/2020   Lab Results  Component Value Date   CREATININE 0.86 11/28/2020   BUN 20 11/28/2020   NA 137 11/28/2020   K 4.3 11/28/2020   CL 102 11/28/2020   CO2 25 11/28/2020    Anesthesia Physical  Anesthesia Plan  ASA: II  Anesthesia Plan: General   Post-op Pain Management:    Induction: Intravenous  PONV Risk Score and Plan: 2 and Dexamethasone, Ondansetron and Treatment may vary due to age or medical condition  Airway Management Planned: Mask  Additional Equipment:   Intra-op Plan:   Post-operative Plan: Extubation in OR  Informed Consent: I have reviewed the patients History and Physical, chart, labs and discussed the procedure including the risks, benefits and alternatives for the proposed anesthesia with the patient or authorized representative who has indicated his/her  understanding and acceptance.     Dental advisory given  Plan Discussed with: CRNA, Anesthesiologist and Surgeon  Anesthesia Plan Comments:       Anesthesia Quick Evaluation

## 2021-01-13 NOTE — Discharge Instructions (Signed)
Leroy Arabian, MD Total Joint Specialist [facility] [place] [address], [address] [place], [place] [address] [phone] POSTOPERATIVE DIRECTIONS  Knee Rehabilitation, Guidelines Following Surgery  Results after knee surgery are often greatly improved when you follow the exercise, range of motion and muscle strengthening exercises prescribed by your doctor. Safety measures are also important to protect the knee from further injury. If any of these exercises cause you to have increased pain or swelling in your knee joint, decrease the amount until you are comfortable again and slowly increase them. If you have problems or questions, call your caregiver or physical therapist for advice.   HOME CARE INSTRUCTIONS  . Remove items at home which could result in a fall. This includes throw rugs or furniture in walking pathways.  . ICE to the affected knee as much as tolerated. Icing helps control swelling. If the swelling is well controlled you will be more comfortable and rehab easier. Continue to use ice on the knee for pain and swelling from surgery. You may notice swelling that will progress down to the foot and ankle. This is normal after surgery. Elevate the leg when you are not up walking on it.    . Continue to use the breathing machine which will help keep your temperature down. It is common for your temperature to cycle up and down following surgery, especially at night when you are not up moving around and exerting yourself. The breathing machine keeps your lungs expanded and your temperature down. . Do not place pillow under the operative knee, focus on keeping the knee straight while resting  DIET You may resume your previous home diet once you are discharged from the hospital.  ACTIVITY For the first 5 days, the key is rest and control of pain and swelling . Do your home exercises twice a day starting on post-operative day 3. On the days you go to physical therapy, just  do the home exercises once that day. . You should rest, ice and elevate the leg for 50 minutes out of every hour. Get up and walk/stretch for 10 minutes per hour. After 5 days you can increase your activity slowly as tolerated. . Walk with your walker as instructed. Use the walker until you are comfortable transitioning to a cane. Walk with the cane in the opposite hand of the operative leg. You may discontinue the cane once you are comfortable and walking steadily. . Avoid periods of inactivity such as sitting longer than an hour when not asleep. This helps prevent blood clots.  . You may discontinue the knee immobilizer once you are able to perform a straight leg raise while lying down. . You may resume a sexual relationship in one month or when given the OK by your doctor.  . You may return to work once you are cleared by your doctor.  . Do not drive a car for 6 weeks or until released by your surgeon.  . Do not drive while taking narcotics.  TED HOSE STOCKINGS Wear the elastic stockings on both legs for three weeks following surgery during the day. You may remove them at night for sleeping.  WEIGHT BEARING Weight bearing as tolerated with assist device (walker, cane, etc) as directed, use it as long as suggested by your surgeon or therapist, typically at least 4-6 weeks.  POSTOPERATIVE CONSTIPATION PROTOCOL Constipation - defined medically as fewer than three stools per week and severe constipation as less than one stool per week.  One of  the most common issues patients have following surgery is constipation.  Even if you have a regular bowel pattern at home, your normal regimen is likely to be disrupted due to multiple reasons following surgery.  Combination of anesthesia, postoperative narcotics, change in appetite and fluid intake all can affect your bowels.  In order to avoid complications following surgery, here are some recommendations in order to help you during your recovery  period.  . Colace (docusate) - Pick up an over-the-counter form of Colace or another stool softener and take twice a day as long as you are requiring postoperative pain medications.  Take with a full glass of water daily.  If you experience loose stools or diarrhea, hold the colace until you stool forms back up. If your symptoms do not get better within 1 week or if they get worse, check with your doctor. . Dulcolax (bisacodyl) - Pick up over-the-counter and take as directed by the product packaging as needed to assist with the movement of your bowels.  Take with a full glass of water.  Use this product as needed if not relieved by Colace only.  . MiraLax (polyethylene glycol) - Pick up over-the-counter to have on hand. MiraLax is a solution that will increase the amount of water in your bowels to assist with bowel movements.  Take as directed and can mix with a glass of water, juice, soda, coffee, or tea. Take if you go more than two days without a movement. Do not use MiraLax more than once per day. Call your doctor if you are still constipated or irregular after using this medication for 7 days in a row.  If you continue to have problems with postoperative constipation, please contact the office for further assistance and recommendations.  If you experience "the worst abdominal pain ever" or develop nausea or vomiting, please contact the office immediatly for further recommendations for treatment.  ITCHING If you experience itching with your medications, try taking only a single pain pill, or even half a pain pill at a time.  You can also use Benadryl over the counter for itching or also to help with sleep.   MEDICATIONS See your medication summary on the "After Visit Summary" that the nursing staff will review with you prior to discharge.  You may have some home medications which will be placed on hold until you complete the course of blood thinner medication.  It is important for you to complete the  blood thinner medication as prescribed by your surgeon.  Continue your approved medications as instructed at time of discharge.  PRECAUTIONS . If you experience chest pain or shortness of breath - call 911 immediately for transfer to the hospital emergency department.  . If you develop a fever greater that 101 F, purulent drainage from wound, increased redness or drainage from wound, foul odor from the wound/dressing, or calf pain - CONTACT YOUR SURGEON.                                                   FOLLOW-UP APPOINTMENTS Make sure you keep all of your appointments after your operation with your surgeon and caregivers. You should call the office at the above phone number and make an appointment for approximately two weeks after the date of your surgery or on the date instructed by your surgeon outlined in  the "After Visit Summary".  RANGE OF MOTION AND STRENGTHENING EXERCISES  Rehabilitation of the knee is important following a knee injury or an operation. After just a few days of immobilization, the muscles of the thigh which control the knee become weakened and shrink (atrophy). Knee exercises are designed to build up the tone and strength of the thigh muscles and to improve knee motion. Often times heat used for twenty to thirty minutes before working out will loosen up your tissues and help with improving the range of motion but do not use heat for the first two weeks following surgery. These exercises can be done on a training (exercise) mat, on the floor, on a table or on a bed. Use what ever works the best and is most comfortable for you Knee exercises include:  . Leg Lifts - While your knee is still immobilized in a splint or cast, you can do straight leg raises. Lift the leg to 60 degrees, hold for 3 sec, and slowly lower the leg. Repeat 10-20 times 2-3 times daily. Perform this exercise against resistance later as your knee gets better.  Javier Docker and Hamstring Sets - Tighten up the muscle on  the front of the thigh (Quad) and hold for 5-10 sec. Repeat this 10-20 times hourly. Hamstring sets are done by pushing the foot backward against an object and holding for 5-10 sec. Repeat as with quad sets.   Leg Slides: Lying on your back, slowly slide your foot toward your buttocks, bending your knee up off the floor (only go as far as is comfortable). Then slowly slide your foot back down until your leg is flat on the floor again.  Angel Wings: Lying on your back spread your legs to the side as far apart as you can without causing discomfort.  A rehabilitation program following serious knee injuries can speed recovery and prevent re-injury in the future due to weakened muscles. Contact your doctor or a physical therapist for more information on knee rehabilitation.   POST-OPERATIVE OPIOID TAPER INSTRUCTIONS: . It is important to wean off of your opioid medication as soon as possible. If you do not need pain medication after your surgery it is ok to stop day one. Marland Kitchen Opioids include: o Codeine, Hydrocodone(Norco, Vicodin), Oxycodone(Percocet, oxycontin) and hydromorphone amongst others.  . Long term and even short term use of opiods can cause: o Increased pain response o Dependence o Constipation o Depression o Respiratory depression o And more.  . Withdrawal symptoms can include o Flu like symptoms o Nausea, vomiting o And more . Techniques to manage these symptoms o Hydrate well o Eat regular healthy meals o Stay active o Use relaxation techniques(deep breathing, meditating, yoga) . Do Not substitute Alcohol to help with tapering . If you have been on opioids for less than two weeks and do not have pain than it is ok to stop all together.  . Plan to wean off of opioids o This plan should start within one week post op of your joint replacement. o Maintain the same interval or time between taking each dose and first decrease the dose.  o Cut the total daily intake of opioids by one  tablet each day o Next start to increase the time between doses. o The last dose that should be eliminated is the evening dose.     IF YOU ARE TRANSFERRED TO A SKILLED REHAB FACILITY If the patient is transferred to a skilled rehab facility following release from the hospital,  a list of the current medications will be sent to the facility for the patient to continue.  When discharged from the skilled rehab facility, please have the facility set up the patient's [place] prior to being released. Also, the skilled facility will be responsible for providing the patient with their medications at time of release from the facility to include their pain medication, the muscle relaxants, and their blood thinner medication. If the patient is still at the rehab facility at time of the two week follow up appointment, the skilled rehab facility will also need to assist the patient in arranging follow up appointment in our office and any transportation needs.  MAKE SURE YOU:  . Understand these instructions.  . Get help right away if you are not doing well or get worse.   DENTAL ANTIBIOTICS:  In most cases prophylactic antibiotics for Dental procdeures after total joint surgery are not necessary.  Exceptions are as follows:  1. History of prior total joint infection  2. Severely immunocompromised (Organ Transplant, cancer chemotherapy, Rheumatoid biologic meds such as [place])  3. Poorly controlled diabetes (A1C &gt; 8.0, blood glucose over 200)  If you have one of these conditions, contact your surgeon for an antibiotic prescription, prior to your dental procedure.    Pick up stool softner and laxative for home use following surgery while on pain medications. Do not submerge incision under water. Please use good hand washing techniques while changing dressing each day. May shower starting three days after surgery. Please use a clean towel to pat the incision dry following  showers. Continue to use ice for pain and swelling after surgery. Do not use any lotions or creams on the incision until instructed by your surgeon.

## 2021-01-13 NOTE — Transfer of Care (Signed)
Immediate Anesthesia Transfer of Care Note  Patient: RICHMOND COLDREN  Procedure(s) Performed: CLOSED MANIPULATION KNEE (Right Knee)  Patient Location: PACU  Anesthesia Type:General  Level of Consciousness: sedated and responds to stimulation  Airway & Oxygen Therapy: Patient Spontanous Breathing and Patient connected to face mask oxygen  Post-op Assessment: Report given to RN and Post -op Vital signs reviewed and stable  Post vital signs: Reviewed and stable  Last Vitals:  Vitals Value Taken Time  BP 158/76 01/13/21 1418  Temp    Pulse 66 01/13/21 1419  Resp 10 01/13/21 1419  SpO2 100 % 01/13/21 1419  Vitals shown include unvalidated device data.  Last Pain:  Vitals:   01/13/21 1249  TempSrc:   PainSc: 0-No pain      Patients Stated Pain Goal: 3 (07/28/74 8832)  Complications: No complications documented.

## 2021-01-14 ENCOUNTER — Encounter (HOSPITAL_COMMUNITY): Payer: Self-pay | Admitting: Orthopedic Surgery

## 2021-04-25 ENCOUNTER — Other Ambulatory Visit: Payer: Self-pay

## 2021-04-25 ENCOUNTER — Inpatient Hospital Stay: Payer: Medicare Other | Attending: Hematology & Oncology

## 2021-04-25 ENCOUNTER — Inpatient Hospital Stay (HOSPITAL_BASED_OUTPATIENT_CLINIC_OR_DEPARTMENT_OTHER): Payer: Medicare Other | Admitting: Hematology & Oncology

## 2021-04-25 ENCOUNTER — Encounter: Payer: Self-pay | Admitting: Hematology & Oncology

## 2021-04-25 ENCOUNTER — Telehealth: Payer: Self-pay

## 2021-04-25 VITALS — BP 110/63 | HR 70 | Temp 98.7°F | Resp 20 | Wt 161.0 lb

## 2021-04-25 DIAGNOSIS — Z7982 Long term (current) use of aspirin: Secondary | ICD-10-CM | POA: Diagnosis not present

## 2021-04-25 DIAGNOSIS — C20 Malignant neoplasm of rectum: Secondary | ICD-10-CM

## 2021-04-25 DIAGNOSIS — Z85048 Personal history of other malignant neoplasm of rectum, rectosigmoid junction, and anus: Secondary | ICD-10-CM | POA: Diagnosis not present

## 2021-04-25 DIAGNOSIS — Z933 Colostomy status: Secondary | ICD-10-CM | POA: Diagnosis not present

## 2021-04-25 DIAGNOSIS — Z794 Long term (current) use of insulin: Secondary | ICD-10-CM | POA: Diagnosis not present

## 2021-04-25 DIAGNOSIS — Z79899 Other long term (current) drug therapy: Secondary | ICD-10-CM | POA: Insufficient documentation

## 2021-04-25 LAB — CMP (CANCER CENTER ONLY)
ALT: 10 U/L (ref 0–44)
AST: 13 U/L — ABNORMAL LOW (ref 15–41)
Albumin: 4.3 g/dL (ref 3.5–5.0)
Alkaline Phosphatase: 51 U/L (ref 38–126)
Anion gap: 8 (ref 5–15)
BUN: 20 mg/dL (ref 8–23)
CO2: 29 mmol/L (ref 22–32)
Calcium: 9.7 mg/dL (ref 8.9–10.3)
Chloride: 104 mmol/L (ref 98–111)
Creatinine: 0.82 mg/dL (ref 0.61–1.24)
GFR, Estimated: >60 mL/min (ref >60)
Glucose, Bld: 170 mg/dL — ABNORMAL HIGH (ref 70–99)
Potassium: 4.5 mmol/L (ref 3.5–5.1)
Sodium: 141 mmol/L (ref 135–145)
Total Bilirubin: 0.5 mg/dL (ref 0.3–1.2)
Total Protein: 6.8 g/dL (ref 6.5–8.1)

## 2021-04-25 LAB — CBC WITH DIFFERENTIAL (CANCER CENTER ONLY)
Abs Immature Granulocytes: 0.06 10*3/uL (ref 0.00–0.07)
Basophils Absolute: 0.1 10*3/uL (ref 0.0–0.1)
Basophils Relative: 1 %
Eosinophils Absolute: 0.4 10*3/uL (ref 0.0–0.5)
Eosinophils Relative: 4 %
HCT: 44.6 % (ref 39.0–52.0)
Hemoglobin: 14.7 g/dL (ref 13.0–17.0)
Immature Granulocytes: 1 %
Lymphocytes Relative: 21 %
Lymphs Abs: 1.8 10*3/uL (ref 0.7–4.0)
MCH: 29.3 pg (ref 26.0–34.0)
MCHC: 33 g/dL (ref 30.0–36.0)
MCV: 89 fL (ref 80.0–100.0)
Monocytes Absolute: 0.5 10*3/uL (ref 0.1–1.0)
Monocytes Relative: 6 %
Neutro Abs: 6 10*3/uL (ref 1.7–7.7)
Neutrophils Relative %: 67 %
Platelet Count: 257 10*3/uL (ref 150–400)
RBC: 5.01 MIL/uL (ref 4.22–5.81)
RDW: 13.5 % (ref 11.5–15.5)
WBC Count: 8.9 10*3/uL (ref 4.0–10.5)
nRBC: 0 % (ref 0.0–0.2)

## 2021-04-25 NOTE — Progress Notes (Signed)
Hematology and Oncology Follow Up Visit  Leroy Jones [id number] March 05, 1946 75 y.o. 04/25/2021   Principle Diagnosis:  Stage I (T2N0M0) adenocarcinoma of the rectum  Current Therapy:   Observation   Interim History:  Leroy Jones is here today for his annual follow-up.  He had surgery for his right knee.  This was a redo operation.  This was done in April.  The esteemed Dr. Maureen Ralphs did the surgery.  No surprise that everything turned out great.  He has a glucose monitor on now.  This is helping keep his blood sugars more stable.  He does see a dermatologist.  I think he sees one soon.  Otherwise he is doing quite well.  He has his colostomy which is working nicely.  He has had no cough or shortness of breath.  He has had no problems with COVID.  His last CEA level back in September of last year was 1.17.  Currently, his performance status is ECOG 1.    Medications:  Allergies as of 04/25/2021       Reactions   Codeine Nausea And Vomiting   Oxycodone Hcl Nausea And Vomiting   Lisinopril Other (See Comments)   Dizzy        Medication List        Accurate as of April 25, 2021 12:34 PM. If you have any questions, ask your nurse or doctor.          ascorbic acid 500 MG tablet Commonly known as: VITAMIN C Take 500 mg by mouth daily.   aspirin EC 81 MG tablet Take 81 mg by mouth daily. Swallow whole.   empagliflozin 25 MG Tabs tablet Commonly known as: JARDIANCE Take 12.5 mg by mouth daily.   ferrous sulfate 325 (65 FE) MG tablet Take 325 mg by mouth every Tuesday, Thursday, and Saturday at 6 PM.   FLUoxetine 10 MG capsule Commonly known as: PROZAC Take 10 mg by mouth daily.   gabapentin 300 MG capsule Commonly known as: NEURONTIN Take 300 mg by mouth 2 (two) times daily.   glucose 4 GM chewable tablet Chew 4 tablets by mouth as needed for low blood sugar (Repeat every 15 minutes if blood sugar less than 70).   insulin glargine 100 UNIT/ML  injection Commonly known as: LANTUS Inject 10 Units into the skin at bedtime.   MAGNESIUM PO Take 420 mg by mouth daily.   Meclizine HCl 25 MG Chew CHEW ONE TABLET BY MOUTH EVERY 8 HOURS (DO NOT DRIVE IF DROWSY)   metFORMIN 500 MG tablet Commonly known as: GLUCOPHAGE Take 1,000 mg by mouth 2 (two) times daily with a meal.   methocarbamol 500 MG tablet Commonly known as: Robaxin Take 1 tablet (500 mg total) by mouth every 6 (six) hours as needed for muscle spasms.   NovoLOG FlexPen 100 UNIT/ML FlexPen Generic drug: insulin aspart Inject 10-12 Units into the skin 4 (four) times daily -  with meals and at bedtime. Sliding scale- 0-80=0, 80-100=5, 101-140=6, 141-180=7, 181-220=8, 221-260=9, 261-320=10, 321-340=11, over = 12 units per pt at preop appt   rosuvastatin 40 MG tablet Commonly known as: CRESTOR Take 40 mg by mouth daily.   vitamin B-12 500 MCG tablet Commonly known as: CYANOCOBALAMIN Take 1,000 mcg by mouth daily.        Allergies:  Allergies  Allergen Reactions   Codeine Nausea And Vomiting   Oxycodone Hcl Nausea And Vomiting   Lisinopril Other (See Comments)    Dizzy  Past Medical History, Surgical history, Social history, and Family History were reviewed and updated.  Review of Systems: Review of Systems  Constitutional: Negative.   HENT: Negative.    Eyes: Negative.   Respiratory: Negative.    Cardiovascular: Negative.   Gastrointestinal: Negative.   Genitourinary: Negative.   Musculoskeletal: Negative.   Skin: Negative.   Neurological: Negative.   Endo/Heme/Allergies: Negative.   Psychiatric/Behavioral: Negative.    Marland Kitchen   Physical Exam:  weight is 161 lb (73 kg). His oral temperature is 98.7 F (37.1 C). His blood pressure is 110/63 and his pulse is 70. His respiration is 20.   Wt Readings from Last 3 Encounters:  04/25/21 161 lb (73 kg)  01/13/21 164 lb (74.4 kg)  11/27/20 161 lb (73 kg)    Physical Exam Vitals reviewed.  HENT:      Head: Normocephalic and atraumatic.  Eyes:     Pupils: Pupils are equal, round, and reactive to light.  Cardiovascular:     Rate and Rhythm: Normal rate and regular rhythm.     Heart sounds: Normal heart sounds.  Pulmonary:     Effort: Pulmonary effort is normal.     Breath sounds: Normal breath sounds.  Abdominal:     General: Bowel sounds are normal.     Palpations: Abdomen is soft.  Musculoskeletal:        General: No tenderness or deformity. Normal range of motion.     Cervical back: Normal range of motion.  Lymphadenopathy:     Cervical: No cervical adenopathy.  Skin:    General: Skin is warm and dry.     Findings: No erythema or rash.  Neurological:     Mental Status: He is alert and oriented to person, place, and time.  Psychiatric:        Behavior: Behavior normal.        Thought Content: Thought content normal.        Judgment: Judgment normal.     Lab Results  Component Value Date   WBC 8.9 04/25/2021   HGB 14.7 04/25/2021   HCT 44.6 04/25/2021   MCV 89.0 04/25/2021   PLT 257 04/25/2021   No results found for: FERRITIN, IRON, TIBC, UIBC, IRONPCTSAT Lab Results  Component Value Date   RBC 5.01 04/25/2021   No results found for: KPAFRELGTCHN, LAMBDASER, KAPLAMBRATIO No results found for: IGGSERUM, IGA, IGMSERUM No results found for: Odetta Pink, SPEI   Chemistry      Component Value Date/Time   NA 141 04/25/2021 1135   NA 143 09/07/2016 1427   K 4.5 04/25/2021 1135   K 4.3 09/07/2016 1427   CL 104 04/25/2021 1135   CL 104 09/07/2016 1427   CO2 29 04/25/2021 1135   CO2 29 09/07/2016 1427   BUN 20 04/25/2021 1135   BUN 14 09/07/2016 1427   CREATININE 0.82 04/25/2021 1135   CREATININE 0.8 09/07/2016 1427      Component Value Date/Time   CALCIUM 9.7 04/25/2021 1135   CALCIUM 9.4 09/07/2016 1427   ALKPHOS 51 04/25/2021 1135   ALKPHOS 56 09/07/2016 1427   AST 13 (L) 04/25/2021 1135   ALT 10  04/25/2021 1135   ALT 32 09/07/2016 1427   BILITOT 0.5 04/25/2021 1135      Impression and Plan: Leroy Jones is a very pleasant 75 yo caucasian gentleman with stage I rectal cancer diagnosed back in 2001.   He has done well and has  no complaints at this time.   We will continue to follow along with him annually.   He will contact our office with any questions or concerns. We can certainly see him sooner if need be.   Volanda Napoleon, MD 9/16/202212:34 PM

## 2021-04-25 NOTE — Telephone Encounter (Signed)
Appts made and a calendar mailed to pt per 04/25/21 los  [employer]

## 2021-04-28 LAB — CEA (IN HOUSE-CHCC): CEA (CHCC-In House): 1.77 ng/mL (ref 0.00–5.00)

## 2021-05-17 DIAGNOSIS — Z23 Encounter for immunization: Secondary | ICD-10-CM | POA: Diagnosis not present

## 2022-04-27 ENCOUNTER — Encounter: Payer: Self-pay | Admitting: Hematology & Oncology

## 2022-04-27 ENCOUNTER — Inpatient Hospital Stay (HOSPITAL_BASED_OUTPATIENT_CLINIC_OR_DEPARTMENT_OTHER): Payer: Medicare Other | Admitting: Hematology & Oncology

## 2022-04-27 ENCOUNTER — Other Ambulatory Visit: Payer: Self-pay | Admitting: *Deleted

## 2022-04-27 ENCOUNTER — Inpatient Hospital Stay: Payer: Medicare Other | Attending: Hematology & Oncology

## 2022-04-27 ENCOUNTER — Other Ambulatory Visit: Payer: Self-pay

## 2022-04-27 VITALS — BP 122/51 | HR 59 | Temp 98.2°F | Resp 19 | Ht 66.93 in | Wt 169.4 lb

## 2022-04-27 DIAGNOSIS — E119 Type 2 diabetes mellitus without complications: Secondary | ICD-10-CM | POA: Insufficient documentation

## 2022-04-27 DIAGNOSIS — Z79899 Other long term (current) drug therapy: Secondary | ICD-10-CM | POA: Diagnosis not present

## 2022-04-27 DIAGNOSIS — C20 Malignant neoplasm of rectum: Secondary | ICD-10-CM

## 2022-04-27 DIAGNOSIS — Z85048 Personal history of other malignant neoplasm of rectum, rectosigmoid junction, and anus: Secondary | ICD-10-CM | POA: Insufficient documentation

## 2022-04-27 DIAGNOSIS — Z794 Long term (current) use of insulin: Secondary | ICD-10-CM | POA: Diagnosis not present

## 2022-04-27 DIAGNOSIS — Z7982 Long term (current) use of aspirin: Secondary | ICD-10-CM | POA: Diagnosis not present

## 2022-04-27 LAB — CBC WITH DIFFERENTIAL (CANCER CENTER ONLY)
Abs Immature Granulocytes: 0.02 10*3/uL (ref 0.00–0.07)
Basophils Absolute: 0.1 10*3/uL (ref 0.0–0.1)
Basophils Relative: 1 %
Eosinophils Absolute: 0.4 10*3/uL (ref 0.0–0.5)
Eosinophils Relative: 4 %
HCT: 43.6 % (ref 39.0–52.0)
Hemoglobin: 14.3 g/dL (ref 13.0–17.0)
Immature Granulocytes: 0 %
Lymphocytes Relative: 23 %
Lymphs Abs: 1.9 10*3/uL (ref 0.7–4.0)
MCH: 31 pg (ref 26.0–34.0)
MCHC: 32.8 g/dL (ref 30.0–36.0)
MCV: 94.6 fL (ref 80.0–100.0)
Monocytes Absolute: 0.6 10*3/uL (ref 0.1–1.0)
Monocytes Relative: 7 %
Neutro Abs: 5.3 10*3/uL (ref 1.7–7.7)
Neutrophils Relative %: 65 %
Platelet Count: 240 10*3/uL (ref 150–400)
RBC: 4.61 MIL/uL (ref 4.22–5.81)
RDW: 12.6 % (ref 11.5–15.5)
WBC Count: 8.1 10*3/uL (ref 4.0–10.5)
nRBC: 0 % (ref 0.0–0.2)

## 2022-04-27 LAB — COMPREHENSIVE METABOLIC PANEL
ALT: 16 U/L (ref 0–44)
AST: 17 U/L (ref 15–41)
Albumin: 4.2 g/dL (ref 3.5–5.0)
Alkaline Phosphatase: 44 U/L (ref 38–126)
Anion gap: 7 (ref 5–15)
BUN: 19 mg/dL (ref 8–23)
CO2: 31 mmol/L (ref 22–32)
Calcium: 10 mg/dL (ref 8.9–10.3)
Chloride: 103 mmol/L (ref 98–111)
Creatinine, Ser: 0.92 mg/dL (ref 0.61–1.24)
GFR, Estimated: >60 mL/min (ref >60)
Glucose, Bld: 194 mg/dL — ABNORMAL HIGH (ref 70–99)
Potassium: 5.4 mmol/L — ABNORMAL HIGH (ref 3.5–5.1)
Sodium: 141 mmol/L (ref 135–145)
Total Bilirubin: 0.5 mg/dL (ref 0.3–1.2)
Total Protein: 6.7 g/dL (ref 6.5–8.1)

## 2022-04-27 NOTE — Progress Notes (Signed)
Hematology and Oncology Follow Up Visit  Leroy Jones [id number] [birth year] 76 y.o. 04/27/2022   Principle Diagnosis:  Stage I (T2N0M0) adenocarcinoma of the rectum  Current Therapy:   Observation   Interim History:  Leroy Jones is here today for his annual follow-up.  He actually comes in with his wife.  He is doing okay although his poor right knee is not doing well at all.  He has surgery for this a year and a half ago.  He just has not made a good recovery.  According to his wife, he just never really did the physical therapy that was necessary.  Otherwise, he seems to be managing.  He has the CGM to help with his diabetes.  His blood sugar was high for [place].  However when we checked it in the office, his blood sugar was 137.  He has had no problems with the colostomy.  He has had no nausea or vomiting.  He has had no cough or shortness of breath.  He has had no rashes.  There is been no bleeding.  His last CEA level was normal at 1.77.   Overall, I would say his performance status is probably ECOG 1.    Medications:  Allergies as of 04/27/2022       Reactions   Codeine Nausea And Vomiting   Lisinopril Other (See Comments)   Dizzy   Oxycodone Hcl Nausea And Vomiting        Medication List        Accurate as of April 27, 2022  1:00 PM. If you have any questions, ask your nurse or doctor.          ascorbic acid 500 MG tablet Commonly known as: VITAMIN C Take 500 mg by mouth daily.   aspirin EC 81 MG tablet Take 81 mg by mouth daily. Swallow whole.   cyanocobalamin 500 MCG tablet Commonly known as: VITAMIN B12 Take 1,000 mcg by mouth daily.   empagliflozin 25 MG Tabs tablet Commonly known as: JARDIANCE Take 12.5 mg by mouth daily.   ferrous sulfate 325 (65 FE) MG tablet Take 325 mg by mouth every Tuesday, Thursday, and Saturday at 6 PM.   FLUoxetine 10 MG capsule Commonly known as: PROZAC Take 10 mg by mouth daily.   gabapentin 300 MG  capsule Commonly known as: NEURONTIN Take 300 mg by mouth 2 (two) times daily.   glucose 4 GM chewable tablet Chew 4 tablets by mouth as needed for low blood sugar (Repeat every 15 minutes if blood sugar less than 70).   insulin glargine 100 UNIT/ML injection Commonly known as: LANTUS Inject 10 Units into the skin at bedtime.   MAGNESIUM PO Take 420 mg by mouth daily.   Meclizine HCl 25 MG Chew CHEW ONE TABLET BY MOUTH EVERY 8 HOURS (DO NOT DRIVE IF DROWSY)   metFORMIN 500 MG tablet Commonly known as: GLUCOPHAGE Take 1,000 mg by mouth 2 (two) times daily with a meal.   methocarbamol 500 MG tablet Commonly known as: Robaxin Take 1 tablet (500 mg total) by mouth every 6 (six) hours as needed for muscle spasms.   NovoLOG FlexPen 100 UNIT/ML FlexPen Generic drug: insulin aspart Inject 10-12 Units into the skin 4 (four) times daily -  with meals and at bedtime. Sliding scale- 0-80=0, 80-100=5, 101-140=6, 141-180=7, 181-220=8, 221-260=9, 261-320=10, 321-340=11, over = 12 units per pt at preop appt   rosuvastatin 40 MG tablet Commonly known as: CRESTOR Take 40 mg by  mouth daily.        Allergies:  Allergies  Allergen Reactions   Codeine Nausea And Vomiting   Lisinopril Other (See Comments)    Dizzy   Oxycodone Hcl Nausea And Vomiting    Past Medical History, Surgical history, Social history, and Family History were reviewed and updated.  Review of Systems: Review of Systems  Constitutional: Negative.   HENT: Negative.    Eyes: Negative.   Respiratory: Negative.    Cardiovascular: Negative.   Gastrointestinal: Negative.   Genitourinary: Negative.   Musculoskeletal: Negative.   Skin: Negative.   Neurological: Negative.   Endo/Heme/Allergies: Negative.   Psychiatric/Behavioral: Negative.     Marland Kitchen   Physical Exam:  height is 5' 6.93" (1.7 m) and weight is 169 lb 6.4 oz (76.8 kg). His oral temperature is 98.2 F (36.8 C). His blood pressure is 122/51 (abnormal) and  his pulse is 59 (abnormal). His respiration is 19 and oxygen saturation is 100%.   Wt Readings from Last 3 Encounters:  04/27/22 169 lb 6.4 oz (76.8 kg)  04/25/21 161 lb (73 kg)  01/13/21 164 lb (74.4 kg)    Physical Exam Vitals reviewed.  HENT:     Head: Normocephalic and atraumatic.  Eyes:     Pupils: Pupils are equal, round, and reactive to light.  Cardiovascular:     Rate and Rhythm: Normal rate and regular rhythm.     Heart sounds: Normal heart sounds.  Pulmonary:     Effort: Pulmonary effort is normal.     Breath sounds: Normal breath sounds.  Abdominal:     General: Bowel sounds are normal.     Palpations: Abdomen is soft.  Musculoskeletal:        General: No tenderness or deformity. Normal range of motion.     Cervical back: Normal range of motion.  Lymphadenopathy:     Cervical: No cervical adenopathy.  Skin:    General: Skin is warm and dry.     Findings: No erythema or rash.  Neurological:     Mental Status: He is alert and oriented to person, place, and time.  Psychiatric:        Behavior: Behavior normal.        Thought Content: Thought content normal.        Judgment: Judgment normal.     Lab Results  Component Value Date   WBC 8.1 04/27/2022   HGB 14.3 04/27/2022   HCT 43.6 04/27/2022   MCV 94.6 04/27/2022   PLT 240 04/27/2022   No results found for: "FERRITIN", "IRON", "TIBC", "UIBC", "IRONPCTSAT" Lab Results  Component Value Date   RBC 4.61 04/27/2022   No results found for: "KPAFRELGTCHN", "LAMBDASER", "KAPLAMBRATIO" No results found for: "IGGSERUM", "IGA", "IGMSERUM" No results found for: "TOTALPROTELP", "ALBUMINELP", "A1GS", "A2GS", "BETS", "BETA2SER", "GAMS", "MSPIKE", "SPEI"   Chemistry      Component Value Date/Time   NA 141 04/27/2022 1205   NA 143 09/07/2016 1427   K 5.4 (H) 04/27/2022 1205   K 4.3 09/07/2016 1427   CL 103 04/27/2022 1205   CL 104 09/07/2016 1427   CO2 31 04/27/2022 1205   CO2 29 09/07/2016 1427   BUN 19  04/27/2022 1205   BUN 14 09/07/2016 1427   CREATININE 0.92 04/27/2022 1205   CREATININE 0.82 04/25/2021 1135   CREATININE 0.8 09/07/2016 1427      Component Value Date/Time   CALCIUM 10.0 04/27/2022 1205   CALCIUM 9.4 09/07/2016 1427   ALKPHOS 44 04/27/2022  1205   ALKPHOS 56 09/07/2016 1427   AST 17 04/27/2022 1205   AST 13 (L) 04/25/2021 1135   ALT 16 04/27/2022 1205   ALT 10 04/25/2021 1135   ALT 32 09/07/2016 1427   BILITOT 0.5 04/27/2022 1205   BILITOT 0.5 04/25/2021 1135      Impression and Plan: Leroy Jones is a very pleasant 76 yo caucasian gentleman with stage I rectal cancer diagnosed back in 2001.  He is cured of this.  Like to come back to our office yearly.  He realizes and knows that he will get a very thorough exam when he comes back to see [place].  I feel bad that his right knee just is not doing all that well.,  Sure what Orthopedic Surgery can do for this.  Hopefully, his diabetes will stay under decent control.  We will plan for another follow-up in 1 year.    Volanda Napoleon, MD 9/18/20231:00 PM

## 2022-04-28 LAB — CEA (ACCESS): CEA (CHCC): 1.85 ng/mL (ref 0.00–5.00)

## 2022-05-04 DIAGNOSIS — Z23 Encounter for immunization: Secondary | ICD-10-CM | POA: Diagnosis not present

## 2022-08-05 DIAGNOSIS — Z23 Encounter for immunization: Secondary | ICD-10-CM | POA: Diagnosis not present

## 2023-04-28 ENCOUNTER — Inpatient Hospital Stay: Payer: Medicare Other | Admitting: Family

## 2023-04-28 ENCOUNTER — Inpatient Hospital Stay: Payer: Medicare Other | Attending: Hematology & Oncology

## 2023-05-17 DIAGNOSIS — Z23 Encounter for immunization: Secondary | ICD-10-CM | POA: Diagnosis not present

## 2023-10-18 DIAGNOSIS — H524 Presbyopia: Secondary | ICD-10-CM | POA: Diagnosis not present

## 2023-10-18 DIAGNOSIS — H31013 Macula scars of posterior pole (postinflammatory) (post-traumatic), bilateral: Secondary | ICD-10-CM | POA: Diagnosis not present

## 2023-10-18 DIAGNOSIS — H26493 Other secondary cataract, bilateral: Secondary | ICD-10-CM | POA: Diagnosis not present

## 2023-10-18 DIAGNOSIS — H04123 Dry eye syndrome of bilateral lacrimal glands: Secondary | ICD-10-CM | POA: Diagnosis not present

## 2023-10-18 DIAGNOSIS — H35363 Drusen (degenerative) of macula, bilateral: Secondary | ICD-10-CM | POA: Diagnosis not present

## 2024-04-24 DIAGNOSIS — Z23 Encounter for immunization: Secondary | ICD-10-CM | POA: Diagnosis not present

## 2024-04-26 ENCOUNTER — Other Ambulatory Visit: Payer: Self-pay

## 2024-04-26 DIAGNOSIS — C20 Malignant neoplasm of rectum: Secondary | ICD-10-CM

## 2024-04-27 ENCOUNTER — Encounter: Payer: Self-pay | Admitting: Hematology & Oncology

## 2024-04-27 ENCOUNTER — Inpatient Hospital Stay (HOSPITAL_BASED_OUTPATIENT_CLINIC_OR_DEPARTMENT_OTHER): Payer: Medicare Other | Admitting: Hematology & Oncology

## 2024-04-27 ENCOUNTER — Inpatient Hospital Stay: Payer: Medicare Other | Attending: Hematology & Oncology

## 2024-04-27 VITALS — BP 123/56 | HR 72 | Temp 98.5°F | Resp 17 | Wt 178.0 lb

## 2024-04-27 DIAGNOSIS — Z7984 Long term (current) use of oral hypoglycemic drugs: Secondary | ICD-10-CM | POA: Insufficient documentation

## 2024-04-27 DIAGNOSIS — Z794 Long term (current) use of insulin: Secondary | ICD-10-CM | POA: Insufficient documentation

## 2024-04-27 DIAGNOSIS — E1140 Type 2 diabetes mellitus with diabetic neuropathy, unspecified: Secondary | ICD-10-CM | POA: Diagnosis not present

## 2024-04-27 DIAGNOSIS — C20 Malignant neoplasm of rectum: Secondary | ICD-10-CM

## 2024-04-27 DIAGNOSIS — Z85048 Personal history of other malignant neoplasm of rectum, rectosigmoid junction, and anus: Secondary | ICD-10-CM | POA: Insufficient documentation

## 2024-04-27 LAB — CBC WITH DIFFERENTIAL (CANCER CENTER ONLY)
Abs Immature Granulocytes: 0.06 K/uL (ref 0.00–0.07)
Basophils Absolute: 0.1 K/uL (ref 0.0–0.1)
Basophils Relative: 1 %
Eosinophils Absolute: 0.3 K/uL (ref 0.0–0.5)
Eosinophils Relative: 3 %
HCT: 42.7 % (ref 39.0–52.0)
Hemoglobin: 14.1 g/dL (ref 13.0–17.0)
Immature Granulocytes: 1 %
Lymphocytes Relative: 24 %
Lymphs Abs: 2 K/uL (ref 0.7–4.0)
MCH: 30.3 pg (ref 26.0–34.0)
MCHC: 33 g/dL (ref 30.0–36.0)
MCV: 91.8 fL (ref 80.0–100.0)
Monocytes Absolute: 0.6 K/uL (ref 0.1–1.0)
Monocytes Relative: 7 %
Neutro Abs: 5.3 K/uL (ref 1.7–7.7)
Neutrophils Relative %: 64 %
Platelet Count: 238 K/uL (ref 150–400)
RBC: 4.65 MIL/uL (ref 4.22–5.81)
RDW: 12.9 % (ref 11.5–15.5)
WBC Count: 8.2 K/uL (ref 4.0–10.5)
nRBC: 0 % (ref 0.0–0.2)

## 2024-04-27 LAB — CEA (ACCESS): CEA (CHCC): 1.7 ng/mL (ref 0.00–5.00)

## 2024-04-27 NOTE — Progress Notes (Signed)
 Hematology and Oncology Follow Up Visit  Leroy Jones [id number] [birth year] 78 y.o. 04/27/2024   Principle Diagnosis:  Stage I (T2N0M0) adenocarcinoma of the rectum  Current Therapy:   Observation   Interim History:  Leroy Jones is here today for his annual follow-up.  It actually has been 2 years since we last saw him.  Since then, he has been doing well.  He still dealing with his diabetes.  He does have a  CGM.  This is on his abdomen.  His blood sugars range all over the place.  He has had no problems with bowels or bladder.SABRA  He does get around with a cane.  He has not fallen.  He does have problems with arthritis.  When we last saw him, his CEA was 125.  He has had no problems with leg swelling.  He has had little bit of neuropathy..  He has had no fever.  He has avoided COVID.  Is been no headaches.  He has had no visual issues.  Overall, I would say that his performance status is probably ECOG 1.  Medications:  Allergies as of 04/27/2024       Reactions   Codeine Nausea And Vomiting   Lisinopril Other (See Comments)   Dizzy   Oxycodone Hcl Nausea And Vomiting        Medication List        Accurate as of April 27, 2024  1:05 PM. If you have any questions, ask your nurse or doctor.          ascorbic acid 500 MG tablet Commonly known as: VITAMIN C Take 500 mg by mouth daily.   aspirin EC 81 MG tablet Take 81 mg by mouth daily. Swallow whole.   cyanocobalamin 500 MCG tablet Commonly known as: VITAMIN B12 Take 1,000 mcg by mouth daily.   empagliflozin  25 MG Tabs tablet Commonly known as: JARDIANCE  Take 12.5 mg by mouth daily.   ferrous sulfate 325 (65 FE) MG tablet Take 325 mg by mouth every Tuesday, Thursday, and Saturday at 6 PM.   FLUoxetine  10 MG capsule Commonly known as: PROZAC  Take 10 mg by mouth daily.   gabapentin  300 MG capsule Commonly known as: NEURONTIN  Take 300 mg by mouth 2 (two) times daily.   glucose 4 GM chewable  tablet Chew 4 tablets by mouth as needed for low blood sugar (Repeat every 15 minutes if blood sugar less than 70).   insulin  glargine 100 UNIT/ML injection Commonly known as: LANTUS  Inject 10 Units into the skin at bedtime.   MAGNESIUM PO Take 420 mg by mouth daily.   Meclizine HCl 25 MG Chew CHEW ONE TABLET BY MOUTH EVERY 8 HOURS (DO NOT DRIVE IF DROWSY)   metFORMIN 500 MG tablet Commonly known as: GLUCOPHAGE Take 1,000 mg by mouth 2 (two) times daily with a meal.   methocarbamol  500 MG tablet Commonly known as: Robaxin  Take 1 tablet (500 mg total) by mouth every 6 (six) hours as needed for muscle spasms.   NovoLOG  FlexPen 100 UNIT/ML FlexPen Generic drug: insulin  aspart Inject 10-12 Units into the skin 4 (four) times daily -  with meals and at bedtime. Sliding scale- 0-80=0, 80-100=5, 101-140=6, 141-180=7, 181-220=8, 221-260=9, 261-320=10, 321-340=11, over = 12 units per pt at preop appt   rosuvastatin 40 MG tablet Commonly known as: CRESTOR Take 40 mg by mouth daily.        Allergies:  Allergies  Allergen Reactions   Codeine Nausea And Vomiting  Lisinopril Other (See Comments)    Dizzy   Oxycodone Hcl Nausea And Vomiting    Past Medical History, Surgical history, Social history, and Family History were reviewed and updated.  Review of Systems: Review of Systems  Constitutional: Negative.   HENT: Negative.    Eyes: Negative.   Respiratory: Negative.    Cardiovascular: Negative.   Gastrointestinal: Negative.   Genitourinary: Negative.   Musculoskeletal: Negative.   Skin: Negative.   Neurological: Negative.   Endo/Heme/Allergies: Negative.   Psychiatric/Behavioral: Negative.     SABRA   Physical Exam:  weight is 178 lb (80.7 kg). His oral temperature is 98.5 F (36.9 C). His blood pressure is 123/56 (abnormal) and his pulse is 72. His respiration is 17 and oxygen saturation is 100%.   Wt Readings from Last 3 Encounters:  04/27/24 178 lb (80.7 kg)   04/27/22 169 lb 6.4 oz (76.8 kg)  04/25/21 161 lb (73 kg)    Physical Exam Vitals reviewed.  HENT:     Head: Normocephalic and atraumatic.  Eyes:     Pupils: Pupils are equal, round, and reactive to light.  Cardiovascular:     Rate and Rhythm: Normal rate and regular rhythm.     Heart sounds: Normal heart sounds.  Pulmonary:     Effort: Pulmonary effort is normal.     Breath sounds: Normal breath sounds.  Abdominal:     General: Bowel sounds are normal.     Palpations: Abdomen is soft.  Musculoskeletal:        General: No tenderness or deformity. Normal range of motion.     Cervical back: Normal range of motion.  Lymphadenopathy:     Cervical: No cervical adenopathy.  Skin:    General: Skin is warm and dry.     Findings: No erythema or rash.  Neurological:     Mental Status: He is alert and oriented to person, place, and time.  Psychiatric:        Behavior: Behavior normal.        Thought Content: Thought content normal.        Judgment: Judgment normal.      Lab Results  Component Value Date   WBC 8.2 04/27/2024   HGB 14.1 04/27/2024   HCT 42.7 04/27/2024   MCV 91.8 04/27/2024   PLT 238 04/27/2024   No results found for: FERRITIN, IRON, TIBC, UIBC, IRONPCTSAT Lab Results  Component Value Date   RBC 4.65 04/27/2024   No results found for: KPAFRELGTCHN, LAMBDASER, KAPLAMBRATIO No results found for: IGGSERUM, IGA, IGMSERUM No results found for: STEPHANY CARLOTA BENSON MARKEL EARLA JOANNIE DOC VICK, SPEI   Chemistry      Component Value Date/Time   NA 141 04/27/2022 1205   NA 143 09/07/2016 1427   K 5.4 (H) 04/27/2022 1205   K 4.3 09/07/2016 1427   CL 103 04/27/2022 1205   CL 104 09/07/2016 1427   CO2 31 04/27/2022 1205   CO2 29 09/07/2016 1427   BUN 19 04/27/2022 1205   BUN 14 09/07/2016 1427   CREATININE 0.92 04/27/2022 1205   CREATININE 0.82 04/25/2021 1135   CREATININE 0.8 09/07/2016 1427       Component Value Date/Time   CALCIUM  10.0 04/27/2022 1205   CALCIUM  9.4 09/07/2016 1427   ALKPHOS 44 04/27/2022 1205   ALKPHOS 56 09/07/2016 1427   AST 17 04/27/2022 1205   AST 13 (L) 04/25/2021 1135   ALT 16 04/27/2022 1205   ALT 10 04/25/2021 1135  ALT 32 09/07/2016 1427   BILITOT 0.5 04/27/2022 1205   BILITOT 0.5 04/25/2021 1135      Impression and Plan: Mr. Shropshire is a very pleasant 78 yo caucasian gentleman with stage I rectal cancer diagnosed back in 2001.  He is cured of this.  He likes to come back to our office yearly.  He realizes and knows that he will get a very thorough exam when he comes back to see us .  I am so glad he is still doing well.  He really has managed nicely.  His long-term problem will be diabetes.  There is always been every since we last saw him.  As always, we will get him back in 1 year.  Maude JONELLE Crease, MD 9/18/20251:05 PM

## 2025-04-27 ENCOUNTER — Ambulatory Visit: Admitting: Hematology & Oncology

## 2025-04-27 ENCOUNTER — Inpatient Hospital Stay
